# Patient Record
Sex: Female | Born: 1935 | Race: Asian | Hispanic: No | Smoking: Never smoker
Health system: Southern US, Community
[De-identification: ages and names within clinical notes are randomized; demographics above are authoritative.]

## PROBLEM LIST (undated history)

## (undated) DIAGNOSIS — E7800 Pure hypercholesterolemia, unspecified: Secondary | ICD-10-CM

## (undated) DIAGNOSIS — E119 Type 2 diabetes mellitus without complications: Secondary | ICD-10-CM

## (undated) DIAGNOSIS — G459 Transient cerebral ischemic attack, unspecified: Secondary | ICD-10-CM

## (undated) DIAGNOSIS — I10 Essential (primary) hypertension: Secondary | ICD-10-CM

## (undated) HISTORY — PX: CATARACT EXTRACTION W/ INTRAOCULAR LENS  IMPLANT, BILATERAL: SHX1307

## (undated) HISTORY — PX: PTERYGIUM EXCISION: SHX2273

## (undated) HISTORY — PX: EYE SURGERY: SHX253

---

## 2004-08-27 HISTORY — PX: CAPSULOTOMY: SHX379

## 2009-07-05 ENCOUNTER — Encounter: Admission: RE | Admit: 2009-07-05 | Discharge: 2009-07-05 | Payer: Self-pay | Admitting: Family Medicine

## 2018-01-23 ENCOUNTER — Other Ambulatory Visit: Payer: Self-pay

## 2018-01-23 ENCOUNTER — Emergency Department (HOSPITAL_COMMUNITY): Payer: Medicare Other

## 2018-01-23 ENCOUNTER — Observation Stay (HOSPITAL_COMMUNITY): Payer: Medicare Other

## 2018-01-23 ENCOUNTER — Observation Stay (HOSPITAL_COMMUNITY)
Admission: EM | Admit: 2018-01-23 | Discharge: 2018-01-24 | Disposition: A | Discharge status: Home / Self Care | Payer: Medicare Other | Attending: Internal Medicine | Admitting: Internal Medicine

## 2018-01-23 ENCOUNTER — Encounter (HOSPITAL_COMMUNITY): Payer: Self-pay | Admitting: Emergency Medicine

## 2018-01-23 DIAGNOSIS — E785 Hyperlipidemia, unspecified: Secondary | ICD-10-CM | POA: Diagnosis present

## 2018-01-23 DIAGNOSIS — I10 Essential (primary) hypertension: Secondary | ICD-10-CM | POA: Diagnosis present

## 2018-01-23 DIAGNOSIS — R29810 Facial weakness: Secondary | ICD-10-CM | POA: Diagnosis not present

## 2018-01-23 DIAGNOSIS — G459 Transient cerebral ischemic attack, unspecified: Secondary | ICD-10-CM | POA: Diagnosis not present

## 2018-01-23 DIAGNOSIS — R531 Weakness: Secondary | ICD-10-CM

## 2018-01-23 DIAGNOSIS — E876 Hypokalemia: Principal | ICD-10-CM | POA: Insufficient documentation

## 2018-01-23 DIAGNOSIS — R4781 Slurred speech: Secondary | ICD-10-CM | POA: Diagnosis present

## 2018-01-23 DIAGNOSIS — E119 Type 2 diabetes mellitus without complications: Secondary | ICD-10-CM | POA: Diagnosis not present

## 2018-01-23 HISTORY — DX: Pure hypercholesterolemia, unspecified: E78.00

## 2018-01-23 HISTORY — DX: Transient cerebral ischemic attack, unspecified: G45.9

## 2018-01-23 HISTORY — DX: Essential (primary) hypertension: I10

## 2018-01-23 HISTORY — DX: Type 2 diabetes mellitus without complications: E11.9

## 2018-01-23 LAB — I-STAT CHEM 8, ED
BUN: 11 mg/dL (ref 6–20)
CALCIUM ION: 1.1 mmol/L — AB (ref 1.15–1.40)
CHLORIDE: 98 mmol/L — AB (ref 101–111)
Creatinine, Ser: 0.5 mg/dL (ref 0.44–1.00)
GLUCOSE: 149 mg/dL — AB (ref 65–99)
HCT: 38 % (ref 36.0–46.0)
Hemoglobin: 12.9 g/dL (ref 12.0–15.0)
Potassium: 3.1 mmol/L — ABNORMAL LOW (ref 3.5–5.1)
SODIUM: 138 mmol/L (ref 135–145)
TCO2: 27 mmol/L (ref 22–32)

## 2018-01-23 LAB — URINALYSIS, ROUTINE W REFLEX MICROSCOPIC
Bilirubin Urine: NEGATIVE
GLUCOSE, UA: 150 mg/dL — AB
Hgb urine dipstick: NEGATIVE
KETONES UR: NEGATIVE mg/dL
LEUKOCYTES UA: NEGATIVE
Nitrite: NEGATIVE
PROTEIN: NEGATIVE mg/dL
Specific Gravity, Urine: 1.019 (ref 1.005–1.030)
pH: 8 (ref 5.0–8.0)

## 2018-01-23 LAB — COMPREHENSIVE METABOLIC PANEL
ALK PHOS: 59 U/L (ref 38–126)
ALT: 20 U/L (ref 14–54)
AST: 26 U/L (ref 15–41)
Albumin: 3.7 g/dL (ref 3.5–5.0)
Anion gap: 12 (ref 5–15)
BILIRUBIN TOTAL: 1 mg/dL (ref 0.3–1.2)
BUN: 11 mg/dL (ref 6–20)
CALCIUM: 8.9 mg/dL (ref 8.9–10.3)
CO2: 25 mmol/L (ref 22–32)
CREATININE: 0.61 mg/dL (ref 0.44–1.00)
Chloride: 100 mmol/L — ABNORMAL LOW (ref 101–111)
GFR calc Af Amer: >60 mL/min (ref >60)
GFR calc non Af Amer: >60 mL/min (ref >60)
GLUCOSE: 145 mg/dL — AB (ref 65–99)
Potassium: 3.2 mmol/L — ABNORMAL LOW (ref 3.5–5.1)
Sodium: 137 mmol/L (ref 135–145)
TOTAL PROTEIN: 6.7 g/dL (ref 6.5–8.1)

## 2018-01-23 LAB — CBC
HEMATOCRIT: 37 % (ref 36.0–46.0)
Hemoglobin: 12.2 g/dL (ref 12.0–15.0)
MCH: 24.3 pg — AB (ref 26.0–34.0)
MCHC: 33 g/dL (ref 30.0–36.0)
MCV: 73.7 fL — AB (ref 78.0–100.0)
PLATELETS: 192 10*3/uL (ref 150–400)
RBC: 5.02 MIL/uL (ref 3.87–5.11)
RDW: 14.8 % (ref 11.5–15.5)
WBC: 6.6 10*3/uL (ref 4.0–10.5)

## 2018-01-23 LAB — DIFFERENTIAL
ABS IMMATURE GRANULOCYTES: 0 10*3/uL (ref 0.0–0.1)
Basophils Absolute: 0 10*3/uL (ref 0.0–0.1)
Basophils Relative: 0 %
Eosinophils Absolute: 0 10*3/uL (ref 0.0–0.7)
Eosinophils Relative: 0 %
IMMATURE GRANULOCYTES: 0 %
LYMPHS ABS: 1.9 10*3/uL (ref 0.7–4.0)
LYMPHS PCT: 29 %
MONOS PCT: 6 %
Monocytes Absolute: 0.4 10*3/uL (ref 0.1–1.0)
Neutro Abs: 4.2 10*3/uL (ref 1.7–7.7)
Neutrophils Relative %: 65 %

## 2018-01-23 LAB — CBG MONITORING, ED: Glucose-Capillary: 139 mg/dL — ABNORMAL HIGH (ref 65–99)

## 2018-01-23 LAB — I-STAT TROPONIN, ED: Troponin i, poc: 0.01 ng/mL (ref 0.00–0.08)

## 2018-01-23 LAB — RAPID URINE DRUG SCREEN, HOSP PERFORMED
Amphetamines: NOT DETECTED
BARBITURATES: NOT DETECTED
Benzodiazepines: NOT DETECTED
COCAINE: NOT DETECTED
Opiates: NOT DETECTED
TETRAHYDROCANNABINOL: NOT DETECTED

## 2018-01-23 LAB — GLUCOSE, CAPILLARY: Glucose-Capillary: 193 mg/dL — ABNORMAL HIGH (ref 65–99)

## 2018-01-23 LAB — PROTIME-INR
INR: 0.89
PROTHROMBIN TIME: 11.9 s (ref 11.4–15.2)

## 2018-01-23 LAB — TSH: TSH: 0.541 u[IU]/mL (ref 0.350–4.500)

## 2018-01-23 LAB — HEMOGLOBIN A1C
HEMOGLOBIN A1C: 6 % — AB (ref 4.8–5.6)
Mean Plasma Glucose: 125.5 mg/dL

## 2018-01-23 LAB — APTT: aPTT: 29 seconds (ref 24–36)

## 2018-01-23 LAB — ETHANOL: Alcohol, Ethyl (B): <10 mg/dL (ref <10)

## 2018-01-23 MED ORDER — STROKE: EARLY STAGES OF RECOVERY BOOK
Freq: Once | Status: AC
  Administered 2018-01-23: 20:00:00
  Filled 2018-01-23: qty 1

## 2018-01-23 MED ORDER — ACETAMINOPHEN 325 MG PO TABS: 650.0000 mg | ORAL_TABLET | ORAL | Status: DC | PRN

## 2018-01-23 MED ORDER — ASPIRIN 325 MG PO TABS
325.0000 mg | ORAL_TABLET | Freq: Every day | ORAL | Status: DC
  Administered 2018-01-24: 325 mg via ORAL
  Filled 2018-01-23: qty 1

## 2018-01-23 MED ORDER — INSULIN ASPART 100 UNIT/ML ~~LOC~~ SOLN: 0.0000 [IU] | Freq: Every day | SUBCUTANEOUS | Status: DC

## 2018-01-23 MED ORDER — IOPAMIDOL (ISOVUE-370) INJECTION 76%
INTRAVENOUS | Status: AC
  Filled 2018-01-23: qty 50

## 2018-01-23 MED ORDER — IOPAMIDOL (ISOVUE-370) INJECTION 76%
INTRAVENOUS | Status: AC
  Administered 2018-01-23: 11:00:00
  Filled 2018-01-23: qty 50

## 2018-01-23 MED ORDER — INSULIN ASPART 100 UNIT/ML ~~LOC~~ SOLN
0.0000 [IU] | Freq: Three times a day (TID) | SUBCUTANEOUS | Status: DC
  Administered 2018-01-24: 1 [IU] via SUBCUTANEOUS

## 2018-01-23 MED ORDER — ACETAMINOPHEN 160 MG/5ML PO SOLN: 650.0000 mg | ORAL | Status: DC | PRN

## 2018-01-23 MED ORDER — ASPIRIN 300 MG RE SUPP: 300.0000 mg | Freq: Every day | RECTAL | Status: DC

## 2018-01-23 MED ORDER — ACETAMINOPHEN 650 MG RE SUPP: 650.0000 mg | RECTAL | Status: DC | PRN

## 2018-01-23 MED ORDER — SODIUM CHLORIDE 0.9 % IV SOLN
INTRAVENOUS | Status: DC
  Administered 2018-01-23: 15:00:00 via INTRAVENOUS

## 2018-01-23 MED ORDER — SENNOSIDES-DOCUSATE SODIUM 8.6-50 MG PO TABS
1.0000 | ORAL_TABLET | Freq: Every evening | ORAL | Status: DC | PRN
Start: 2018-01-23 — End: 2018-01-24

## 2018-01-23 MED ORDER — ENOXAPARIN SODIUM 40 MG/0.4ML ~~LOC~~ SOLN
40.0000 mg | SUBCUTANEOUS | Status: DC
  Administered 2018-01-23: 40 mg via SUBCUTANEOUS
  Filled 2018-01-23 (×3): qty 0.4

## 2018-01-23 MED ORDER — ATORVASTATIN CALCIUM 80 MG PO TABS
80.0000 mg | ORAL_TABLET | Freq: Every day | ORAL | Status: DC
  Administered 2018-01-23 – 2018-01-24 (×2): 80 mg via ORAL
  Filled 2018-01-23 (×3): qty 1

## 2018-01-23 NOTE — ED Notes (Signed)
Pt does not need anxiety meds prior to MRI

## 2018-01-23 NOTE — ED Notes (Signed)
Patient transported to X-ray 

## 2018-01-23 NOTE — ED Provider Notes (Signed)
[facility] EMERGENCY DEPARTMENT Provider Note   CSN: [id number] Arrival date & time: 01/23/18  1019   An emergency department physician performed an initial assessment on this suspected stroke patient at 1044.  History   Chief Complaint Chief Complaint  Patient presents with  . Code Stroke    HPI Shams Fill Pinegar is a 83 y.o. female.  The history is provided by the patient and medical records. A language interpreter was used (Family at bedside aiding in translation).   AYBREE LANYON is a 83 y.o. female  with a PMH of DM, HTN, HLD who presents to the Emergency Department complaining of left-sided weakness and slurred speech. She has noticed some left-sided weakness over the last two weakness, but never had any difficulty with her speech. Felt her usual self last night. This morning, around 8:30 am, husband found patient in the bathroom not wanting to move. She was much weaker on the left than previously over the last two weeks and was not talking. When she did begin speaking again several minutes later, speech was quite slurred and not baseline.   Past Medical History:  Diagnosis Date  . Diabetes mellitus without complication (HCC)   . High cholesterol   . Hypertension     There are no active problems to display for this patient.  OB History   None      Home Medications    Prior to Admission medications   Not on File    Family History Family History  Problem Relation Age of Onset  . Hypertension Mother   . Hypertension Father     Social History Social History   Tobacco Use  . Smoking status: Never Smoker  . Smokeless tobacco: Never Used  Substance Use Topics  . Alcohol use: Not on file  . Drug use: Not on file     Allergies   Patient has no known allergies.   Review of Systems Review of Systems  Neurological: Positive for speech difficulty and weakness.  All other systems reviewed and are negative.    Physical Exam Updated Vital  Signs BP (!) 187/79   Pulse 77   Temp 98.6 F (37 C) (Oral)   Resp (!) 21   Ht 5' (1.524 m)   Wt 48.9 kg (107 lb 12.8 oz)   LMP  (LMP Unknown)   SpO2 99%   BMI 21.05 kg/m   Physical Exam  Constitutional: She is oriented to person, place, and time. She appears well-developed and well-nourished. No distress.  HENT:  Head: Normocephalic and atraumatic.  Cardiovascular: Normal rate, regular rhythm and normal heart sounds.  No murmur heard. Pulmonary/Chest: Effort normal and breath sounds normal. No respiratory distress.  Abdominal: Soft. She exhibits no distension. There is no tenderness.  Musculoskeletal: Normal range of motion.  Neurological: She is alert and oriented to person, place, and time.  Right facial droop, decreased sensation to the left side of the face. Dysmetria with finger-to-nose on the left. Left upper/lower extremity 4/5 muscle strength. 5/5 on right.   Skin: Skin is warm and dry.  Nursing note and vitals reviewed.    ED Treatments / Results  Labs (all labs ordered are listed, but only abnormal results are displayed) Labs Reviewed  CBC - Abnormal; Notable for the following components:      Result Value   MCV 73.7 (*)    MCH 24.3 (*)    All other components within normal limits  COMPREHENSIVE METABOLIC PANEL - Abnormal;  Notable for the following components:   Potassium 3.2 (*)    Chloride 100 (*)    Glucose, Bld 145 (*)    All other components within normal limits  URINALYSIS, ROUTINE W REFLEX MICROSCOPIC - Abnormal; Notable for the following components:   Color, Urine COLORLESS (*)    Glucose, UA 150 (*)    All other components within normal limits  I-STAT CHEM 8, ED - Abnormal; Notable for the following components:   Potassium 3.1 (*)    Chloride 98 (*)    Glucose, Bld 149 (*)    Calcium, Ion 1.10 (*)    All other components within normal limits  ETHANOL  PROTIME-INR  APTT  DIFFERENTIAL  RAPID URINE DRUG SCREEN, HOSP PERFORMED  I-STAT  TROPONIN, ED    EKG None  Radiology Ct Angio Head W Or Wo Contrast  Result Date: 01/23/2018 CLINICAL DATA:  Left-sided weakness EXAM: CT ANGIOGRAPHY HEAD AND NECK CT PERFUSION BRAIN TECHNIQUE: Multidetector CT imaging of the head and neck was performed using the standard protocol during bolus administration of intravenous contrast. Multiplanar CT image reconstructions and MIPs were obtained to evaluate the vascular anatomy. Carotid stenosis measurements (when applicable) are obtained utilizing NASCET criteria, using the distal internal carotid diameter as the denominator. Multiphase CT imaging of the brain was performed following IV bolus contrast injection. Subsequent parametric perfusion maps were calculated using RAPID software. CONTRAST:  Dose not currently available, see chart. COMPARISON:  Noncontrast head CT earlier today FINDINGS: CTA NECK FINDINGS Aortic arch: Atherosclerotic plaque.  No dilatation or dissection. Right carotid system: Atherosclerotic plaque at the common carotid bifurcation primarily affecting the narrowed ECA origin. The ICA is smooth and diffusely patent. Left carotid system: Moderate mainly calcified plaque at the common carotid bifurcation without flow limiting stenosis or ulceration in the proximal ICA. Negative for beading. Vertebral arteries: Subclavian atheromatous narrowing with up to 50% stenosis at the proximal right subclavian. There is moderate (50%) narrowing the origin of the non dominant left vertebral artery. No ulceration or beading. Skeleton: No acute or aggressive finding Other neck: Negative Upper chest: Scanogram shows coarse interstitial opacity at the right base with probable pleural thickening there is scarring at the right apex with volume loss. Review of the MIP images confirms the above findings CTA HEAD FINDINGS Anterior circulation: No emergent occlusion. Calcified plaque along the carotid siphons with up to 50% stenosis on the at the bilateral clinoid  segments (measured on coronal reformats). Mild atheromatous narrowing of bilateral medium size branches. Posterior circulation: Right dominant vertebral artery. Diffusely patent vertebral and basilar arteries. Symmetric PICA and AICA flow. Mild atheromatous narrowing of bilateral posterior cerebral arteries. Negative for aneurysm. Venous sinuses: Patent Anatomic variants: None significant Delayed phase: Not obtained in the emergent setting Review of the MIP images confirms the above findings CT Brain Perfusion Findings: CBF (<30%) Volume: 0mL Perfusion (Tmax>6.0s) volume: 0mL IMPRESSION: 1. No emergent large vessel occlusion or infarct by CT perfusion. 2. Atherosclerosis with up to 50% stenosis at the bilateral ICA clinoid segment, proximal right subclavian artery, and left vertebral origin. 3. No noted ulceration. 4. Right lung opacity seen on scanogram, recommend chest x-ray. Electronically Signed   By: Marnee Spring M.D.   On: 01/23/2018 11:35   Dg Chest 2 View  Result Date: 01/23/2018 CLINICAL DATA:  Left arm weakness. EXAM: CHEST - 2 VIEW COMPARISON:  CT scan of July 05, 2009. FINDINGS: The heart size and mediastinal contours are within normal limits. No pneumothorax is noted.  Left lung is clear. Right apical pleural thickening or scarring is noted as described on prior CT scan. No acute pleural effusion is noted. There is continued presence of large soft tissue density in the right lung base with associated calcifications, consistent with chronic pleural thickening and scarring and associated calcifications described on prior CT scan, most likely due to prior hemothorax or empyema. The visualized skeletal structures are unremarkable. IMPRESSION: Large amount of chronic pleural thickening and scarring is noted in the right lung base with associated calcifications as described on prior CT scan, most likely due to prior hemothorax or empyema. Stable right apical thickening is noted. Electronically Signed    By: Lupita Raider, M.D.   On: 01/23/2018 12:59   Ct Angio Neck W Or Wo Contrast  Result Date: 01/23/2018 CLINICAL DATA:  Left-sided weakness EXAM: CT ANGIOGRAPHY HEAD AND NECK CT PERFUSION BRAIN TECHNIQUE: Multidetector CT imaging of the head and neck was performed using the standard protocol during bolus administration of intravenous contrast. Multiplanar CT image reconstructions and MIPs were obtained to evaluate the vascular anatomy. Carotid stenosis measurements (when applicable) are obtained utilizing NASCET criteria, using the distal internal carotid diameter as the denominator. Multiphase CT imaging of the brain was performed following IV bolus contrast injection. Subsequent parametric perfusion maps were calculated using RAPID software. CONTRAST:  Dose not currently available, see chart. COMPARISON:  Noncontrast head CT earlier today FINDINGS: CTA NECK FINDINGS Aortic arch: Atherosclerotic plaque.  No dilatation or dissection. Right carotid system: Atherosclerotic plaque at the common carotid bifurcation primarily affecting the narrowed ECA origin. The ICA is smooth and diffusely patent. Left carotid system: Moderate mainly calcified plaque at the common carotid bifurcation without flow limiting stenosis or ulceration in the proximal ICA. Negative for beading. Vertebral arteries: Subclavian atheromatous narrowing with up to 50% stenosis at the proximal right subclavian. There is moderate (50%) narrowing the origin of the non dominant left vertebral artery. No ulceration or beading. Skeleton: No acute or aggressive finding Other neck: Negative Upper chest: Scanogram shows coarse interstitial opacity at the right base with probable pleural thickening there is scarring at the right apex with volume loss. Review of the MIP images confirms the above findings CTA HEAD FINDINGS Anterior circulation: No emergent occlusion. Calcified plaque along the carotid siphons with up to 50% stenosis on the at the  bilateral clinoid segments (measured on coronal reformats). Mild atheromatous narrowing of bilateral medium size branches. Posterior circulation: Right dominant vertebral artery. Diffusely patent vertebral and basilar arteries. Symmetric PICA and AICA flow. Mild atheromatous narrowing of bilateral posterior cerebral arteries. Negative for aneurysm. Venous sinuses: Patent Anatomic variants: None significant Delayed phase: Not obtained in the emergent setting Review of the MIP images confirms the above findings CT Brain Perfusion Findings: CBF (<30%) Volume: 0mL Perfusion (Tmax>6.0s) volume: 0mL IMPRESSION: 1. No emergent large vessel occlusion or infarct by CT perfusion. 2. Atherosclerosis with up to 50% stenosis at the bilateral ICA clinoid segment, proximal right subclavian artery, and left vertebral origin. 3. No noted ulceration. 4. Right lung opacity seen on scanogram, recommend chest x-ray. Electronically Signed   By: Marnee Spring M.D.   On: 01/23/2018 11:35   Ct Cerebral Perfusion W Contrast  Result Date: 01/23/2018 CLINICAL DATA:  Left-sided weakness EXAM: CT ANGIOGRAPHY HEAD AND NECK CT PERFUSION BRAIN TECHNIQUE: Multidetector CT imaging of the head and neck was performed using the standard protocol during bolus administration of intravenous contrast. Multiplanar CT image reconstructions and MIPs were obtained to evaluate the  vascular anatomy. Carotid stenosis measurements (when applicable) are obtained utilizing NASCET criteria, using the distal internal carotid diameter as the denominator. Multiphase CT imaging of the brain was performed following IV bolus contrast injection. Subsequent parametric perfusion maps were calculated using RAPID software. CONTRAST:  Dose not currently available, see chart. COMPARISON:  Noncontrast head CT earlier today FINDINGS: CTA NECK FINDINGS Aortic arch: Atherosclerotic plaque.  No dilatation or dissection. Right carotid system: Atherosclerotic plaque at the common  carotid bifurcation primarily affecting the narrowed ECA origin. The ICA is smooth and diffusely patent. Left carotid system: Moderate mainly calcified plaque at the common carotid bifurcation without flow limiting stenosis or ulceration in the proximal ICA. Negative for beading. Vertebral arteries: Subclavian atheromatous narrowing with up to 50% stenosis at the proximal right subclavian. There is moderate (50%) narrowing the origin of the non dominant left vertebral artery. No ulceration or beading. Skeleton: No acute or aggressive finding Other neck: Negative Upper chest: Scanogram shows coarse interstitial opacity at the right base with probable pleural thickening there is scarring at the right apex with volume loss. Review of the MIP images confirms the above findings CTA HEAD FINDINGS Anterior circulation: No emergent occlusion. Calcified plaque along the carotid siphons with up to 50% stenosis on the at the bilateral clinoid segments (measured on coronal reformats). Mild atheromatous narrowing of bilateral medium size branches. Posterior circulation: Right dominant vertebral artery. Diffusely patent vertebral and basilar arteries. Symmetric PICA and AICA flow. Mild atheromatous narrowing of bilateral posterior cerebral arteries. Negative for aneurysm. Venous sinuses: Patent Anatomic variants: None significant Delayed phase: Not obtained in the emergent setting Review of the MIP images confirms the above findings CT Brain Perfusion Findings: CBF (<30%) Volume: 0mL Perfusion (Tmax>6.0s) volume: 0mL IMPRESSION: 1. No emergent large vessel occlusion or infarct by CT perfusion. 2. Atherosclerosis with up to 50% stenosis at the bilateral ICA clinoid segment, proximal right subclavian artery, and left vertebral origin. 3. No noted ulceration. 4. Right lung opacity seen on scanogram, recommend chest x-ray. Electronically Signed   By: Marnee Spring M.D.   On: 01/23/2018 11:35   Ct Head Code Stroke Wo  Contrast  Result Date: 01/23/2018 CLINICAL DATA:  Code stroke.  Left-sided weakness EXAM: CT HEAD WITHOUT CONTRAST TECHNIQUE: Contiguous axial images were obtained from the base of the skull through the vertex without intravenous contrast. COMPARISON:  None. FINDINGS: Brain: No evidence of acute infarction, hemorrhage, hydrocephalus, extra-axial collection or mass lesion/mass effect. Low-density in the cerebral white matter attributed to chronic small vessel ischemia. Lacunar infarcts seen in the left putamen and caudate, age-indeterminate but not contralateral to the symptomatic side Vascular: Atherosclerotic calcification.  No hyperdense vessel. Skull: No acute finding Sinuses/Orbits: No acute finding. Other: These results were communicated to Dr. Amada Jupiter at 11:22 amon 5/30/2019by text page via the [facility] messaging system. ASPECTS [facility] Stroke Program Early CT Score) - Ganglionic level infarction (caudate, lentiform nuclei, internal capsule, insula, M1-M3 cortex): 7 - Supraganglionic infarction (M4-M6 cortex): 3 Total score (0-10 with 10 being normal): 10 IMPRESSION: 1. No acute finding.ASPECTS is 10. 2. Chronic small vessel ischemia. Electronically Signed   By: Marnee Spring M.D.   On: 01/23/2018 11:24    Procedures Procedures (including critical care time)  Medications Ordered in ED Medications  iopamidol (ISOVUE-370) 76 % injection (has no administration in time range)  iopamidol (ISOVUE-370) 76 % injection (  Contrast Given 01/23/18 1100)     Initial Impression / Assessment and Plan / ED Course  I have reviewed  the triage vital signs and the nursing notes.  Pertinent labs & imaging results that were available during my care of the patient were reviewed by me and considered in my medical decision making (see chart for details).    LAYAAN MOTT is a 83 y.o. female who presents to ED for left sided weakness and slurred speech. Code stroke called upon arrival. CT's reviewed with no  acute findings. Neurology has evaluated - please see consultation note for full recommendations.  I appreciate their assistance in patient's care today.  Recommend hospitalist admission for further work-up.  Hospitalist consulted who will admit.  Patient discussed with Dr. Rush Landmark who agrees with treatment plan.   Final Clinical Impressions(s) / ED Diagnoses   Final diagnoses:  Weakness  Slurred speech  Hypokalemia    ED Discharge Orders    None       Clenton Esper, Chase Picket, [place] 01/23/18 1319    Tegeler, Canary Brim, MD 01/23/18 [phone]

## 2018-01-23 NOTE — Consult Note (Addendum)
Requesting Physician: Dr. Julieanne Manson    Chief Complaint: Stroke  History obtained from: Patient's family  HPI:                                                                                                                                         Shannon Coffey is an 83 y.o. female with known hypertension, hyperlipidemia and diabetes.  Patient does not take any antiplatelets.  Approximately 2 weeks ago was noted that she had left-sided weakness.  Per family as she does not speak English, the weakness was approximately 50% less than normal.  She did not go to a physician or come to the hospital for this weakness.  Apparently she went to bed at 2200 hrs. and she was normal last night.  Daughter called her and saw her via video camera at 630 but did not watch her move.  At approximately 830 this morning her husband found patient leaning up against the wall in the bathroom but not wanting to move.  They brought her back to the bed.  Patient needed assistance at that time.  At approximately 9:00 they noticed that she was much weaker on her left and she was mute for about 10 minutes and then she had significant dysarthria.  She was brought to the hospital.  While in triage code stroke was called.  Due to language barrier it was very unclear at what time initially she was last seen normal.  CT of head did not show any acute stroke.   Date last known well: Date: 01/22/2018 Time last known well: Time: 22:00 tPA Given: No: Due to speech resolved and outside of the window NIH stroke scale of 3 Modified Rankin: Rankin Score=1    Past Medical History:  Diagnosis Date  . Diabetes mellitus without complication (HCC)   . High cholesterol   . Hypertension     She has had eye surgery unclear what eye surgery was done  Family History  Problem Relation Age of Onset  . Hypertension Mother   . Hypertension Father        Social History:  reports that she has never smoked. She has never used smokeless tobacco. Her  alcohol and drug histories are not on file.  Allergies: Allergies no known allergies  Medications:  Current Facility-Administered Medications  Medication Dose Route Frequency Provider Last Rate Last Dose  . iopamidol (ISOVUE-370) 76 % injection            No current outpatient medications on file.     ROS:                                                                                                                                       History obtained from Family members  General ROS: negative for - chills, fatigue, fever, night sweats, weight gain or weight loss Psychological ROS: negative for - , hallucinations, memory difficulties, mood swings or  Ophthalmic ROS: negative for - blurry vision, double vision, eye pain or loss of vision ENT ROS: negative for - epistaxis, nasal discharge, oral lesions, sore throat, tinnitus or vertigo Respiratory ROS: negative for - cough,  shortness of breath or wheezing Cardiovascular ROS: negative for - chest pain, dyspnea on exertion,  Gastrointestinal ROS: negative for - abdominal pain, diarrhea,  nausea/vomiting or stool incontinence Genito-Urinary ROS: negative for - dysuria, hematuria, incontinence or urinary frequency/urgency Musculoskeletal ROS: Positive for -  muscular weakness Neurological ROS: as noted in HPI   General Examination:                                                                                                      Blood pressure (!) 162/67, pulse 74, temperature 98.6 F (37 C), temperature source Oral, resp. rate 20, height 5' (1.524 m), weight 48.9 kg (107 lb 12.8 oz), SpO2 95 %.  HEENT-  Normocephalic, no lesions, without obvious abnormality.  Normal external eye and conjunctiva.   Cardiovascular- S1-S2 audible, pulses palpable throughout   Lungs-no rhonchi or wheezing noted, no excessive working  breathing.  Saturations within normal limits Abdomen- All 4 quadrants palpated and nontender Extremities- Warm, dry and intact Musculoskeletal-no joint tenderness, deformity or swelling Skin-warm and dry, no hyperpigmentation, vitiligo, or suspicious lesions  Neurological Examination Mental Status: Patient is alert, she is oriented to her age and hospital but is unable to give the correct month.  Due to language barrier is difficult to have her follow commands but eventually she was able to follow commands.  These were simple commands and not complex commands.  Patient did not show any a aphasia or dysarthria at this time. Cranial Nerves: II: Initially visual fields were inconsistent however given time she was able to count fingers in all 4 quadrants.  Again this was due to language barrier III,IV,  VI: ptosis not present, extra-ocular motions intact bilaterally, pupils equal, round, reactive to light and accommodation V,VII: Right facial droop, facial light touch sensation normal bilaterally VIII: hearing normal bilaterally IX,X: uvula rises symmetrically XI: bilateral shoulder shrug XII: midline tongue extension Motor: Right : Upper extremity   5/5    Left:     Upper extremity   5/5  Lower extremity   4/5     Lower extremity   4/5 Tone and bulk:normal tone throughout; no atrophy noted Sensory: Decreased on the left, sensory to double simultaneous stimuli was very inconsistent Deep Tendon Reflexes: 2+ and symmetric throughout Plantars: Right: downgoing   Left: downgoing Cerebellar: Dysmetria on the left finger-to-nose however this may be secondary to weakness, patient did not understand doing heel-to-shin with her left heel over her right shin but with right heel over her left shin she had no difficulty.   Gait: Not tested   Lab Results: Basic Metabolic Panel: Recent Labs  Lab 01/23/18 1041 01/23/18 1045  NA 137 138  K 3.2* 3.1*  CL 100* 98*  CO2 25  --   GLUCOSE 145* 149*   BUN 11 11  CREATININE 0.61 0.50  CALCIUM 8.9  --     CBC: Recent Labs  Lab 01/23/18 1041 01/23/18 1045  WBC 6.6  --   NEUTROABS 4.2  --   HGB 12.2 12.9  HCT 37.0 38.0  MCV 73.7*  --   PLT 192  --     Lipid Panel: No results for input(s): CHOL, TRIG, HDL, CHOLHDL, VLDL, LDLCALC in the last 168 hours.  CBG: No results for input(s): GLUCAP in the last 168 hours.  Imaging: Ct Angio Head W Or Wo Contrast  Result Date: 01/23/2018 CLINICAL DATA:  Left-sided weakness EXAM: CT ANGIOGRAPHY HEAD AND NECK CT PERFUSION BRAIN TECHNIQUE: Multidetector CT imaging of the head and neck was performed using the standard protocol during bolus administration of intravenous contrast. Multiplanar CT image reconstructions and MIPs were obtained to evaluate the vascular anatomy. Carotid stenosis measurements (when applicable) are obtained utilizing NASCET criteria, using the distal internal carotid diameter as the denominator. Multiphase CT imaging of the brain was performed following IV bolus contrast injection. Subsequent parametric perfusion maps were calculated using RAPID software. CONTRAST:  Dose not currently available, see chart. COMPARISON:  Noncontrast head CT earlier today FINDINGS: CTA NECK FINDINGS Aortic arch: Atherosclerotic plaque.  No dilatation or dissection. Right carotid system: Atherosclerotic plaque at the common carotid bifurcation primarily affecting the narrowed ECA origin. The ICA is smooth and diffusely patent. Left carotid system: Moderate mainly calcified plaque at the common carotid bifurcation without flow limiting stenosis or ulceration in the proximal ICA. Negative for beading. Vertebral arteries: Subclavian atheromatous narrowing with up to 50% stenosis at the proximal right subclavian. There is moderate (50%) narrowing the origin of the non dominant left vertebral artery. No ulceration or beading. Skeleton: No acute or aggressive finding Other neck: Negative Upper chest:  Scanogram shows coarse interstitial opacity at the right base with probable pleural thickening there is scarring at the right apex with volume loss. Review of the MIP images confirms the above findings CTA HEAD FINDINGS Anterior circulation: No emergent occlusion. Calcified plaque along the carotid siphons with up to 50% stenosis on the at the bilateral clinoid segments (measured on coronal reformats). Mild atheromatous narrowing of bilateral medium size branches. Posterior circulation: Right dominant vertebral artery. Diffusely patent vertebral and basilar arteries. Symmetric PICA and AICA flow. Mild atheromatous narrowing of bilateral  posterior cerebral arteries. Negative for aneurysm. Venous sinuses: Patent Anatomic variants: None significant Delayed phase: Not obtained in the emergent setting Review of the MIP images confirms the above findings CT Brain Perfusion Findings: CBF (<30%) Volume: 0mL Perfusion (Tmax>6.0s) volume: 0mL IMPRESSION: 1. No emergent large vessel occlusion or infarct by CT perfusion. 2. Atherosclerosis with up to 50% stenosis at the bilateral ICA clinoid segment, proximal right subclavian artery, and left vertebral origin. 3. No noted ulceration. 4. Right lung opacity seen on scanogram, recommend chest x-ray. Electronically Signed   By: Marnee Spring M.D.   On: 01/23/2018 11:35   Ct Angio Neck W Or Wo Contrast  Result Date: 01/23/2018 CLINICAL DATA:  Left-sided weakness EXAM: CT ANGIOGRAPHY HEAD AND NECK CT PERFUSION BRAIN TECHNIQUE: Multidetector CT imaging of the head and neck was performed using the standard protocol during bolus administration of intravenous contrast. Multiplanar CT image reconstructions and MIPs were obtained to evaluate the vascular anatomy. Carotid stenosis measurements (when applicable) are obtained utilizing NASCET criteria, using the distal internal carotid diameter as the denominator. Multiphase CT imaging of the brain was performed following IV bolus  contrast injection. Subsequent parametric perfusion maps were calculated using RAPID software. CONTRAST:  Dose not currently available, see chart. COMPARISON:  Noncontrast head CT earlier today FINDINGS: CTA NECK FINDINGS Aortic arch: Atherosclerotic plaque.  No dilatation or dissection. Right carotid system: Atherosclerotic plaque at the common carotid bifurcation primarily affecting the narrowed ECA origin. The ICA is smooth and diffusely patent. Left carotid system: Moderate mainly calcified plaque at the common carotid bifurcation without flow limiting stenosis or ulceration in the proximal ICA. Negative for beading. Vertebral arteries: Subclavian atheromatous narrowing with up to 50% stenosis at the proximal right subclavian. There is moderate (50%) narrowing the origin of the non dominant left vertebral artery. No ulceration or beading. Skeleton: No acute or aggressive finding Other neck: Negative Upper chest: Scanogram shows coarse interstitial opacity at the right base with probable pleural thickening there is scarring at the right apex with volume loss. Review of the MIP images confirms the above findings CTA HEAD FINDINGS Anterior circulation: No emergent occlusion. Calcified plaque along the carotid siphons with up to 50% stenosis on the at the bilateral clinoid segments (measured on coronal reformats). Mild atheromatous narrowing of bilateral medium size branches. Posterior circulation: Right dominant vertebral artery. Diffusely patent vertebral and basilar arteries. Symmetric PICA and AICA flow. Mild atheromatous narrowing of bilateral posterior cerebral arteries. Negative for aneurysm. Venous sinuses: Patent Anatomic variants: None significant Delayed phase: Not obtained in the emergent setting Review of the MIP images confirms the above findings CT Brain Perfusion Findings: CBF (<30%) Volume: 0mL Perfusion (Tmax>6.0s) volume: 0mL IMPRESSION: 1. No emergent large vessel occlusion or infarct by CT  perfusion. 2. Atherosclerosis with up to 50% stenosis at the bilateral ICA clinoid segment, proximal right subclavian artery, and left vertebral origin. 3. No noted ulceration. 4. Right lung opacity seen on scanogram, recommend chest x-ray. Electronically Signed   By: Marnee Spring M.D.   On: 01/23/2018 11:35   Ct Cerebral Perfusion W Contrast  Result Date: 01/23/2018 CLINICAL DATA:  Left-sided weakness EXAM: CT ANGIOGRAPHY HEAD AND NECK CT PERFUSION BRAIN TECHNIQUE: Multidetector CT imaging of the head and neck was performed using the standard protocol during bolus administration of intravenous contrast. Multiplanar CT image reconstructions and MIPs were obtained to evaluate the vascular anatomy. Carotid stenosis measurements (when applicable) are obtained utilizing NASCET criteria, using the distal internal carotid diameter as the denominator.  Multiphase CT imaging of the brain was performed following IV bolus contrast injection. Subsequent parametric perfusion maps were calculated using RAPID software. CONTRAST:  Dose not currently available, see chart. COMPARISON:  Noncontrast head CT earlier today FINDINGS: CTA NECK FINDINGS Aortic arch: Atherosclerotic plaque.  No dilatation or dissection. Right carotid system: Atherosclerotic plaque at the common carotid bifurcation primarily affecting the narrowed ECA origin. The ICA is smooth and diffusely patent. Left carotid system: Moderate mainly calcified plaque at the common carotid bifurcation without flow limiting stenosis or ulceration in the proximal ICA. Negative for beading. Vertebral arteries: Subclavian atheromatous narrowing with up to 50% stenosis at the proximal right subclavian. There is moderate (50%) narrowing the origin of the non dominant left vertebral artery. No ulceration or beading. Skeleton: No acute or aggressive finding Other neck: Negative Upper chest: Scanogram shows coarse interstitial opacity at the right base with probable pleural  thickening there is scarring at the right apex with volume loss. Review of the MIP images confirms the above findings CTA HEAD FINDINGS Anterior circulation: No emergent occlusion. Calcified plaque along the carotid siphons with up to 50% stenosis on the at the bilateral clinoid segments (measured on coronal reformats). Mild atheromatous narrowing of bilateral medium size branches. Posterior circulation: Right dominant vertebral artery. Diffusely patent vertebral and basilar arteries. Symmetric PICA and AICA flow. Mild atheromatous narrowing of bilateral posterior cerebral arteries. Negative for aneurysm. Venous sinuses: Patent Anatomic variants: None significant Delayed phase: Not obtained in the emergent setting Review of the MIP images confirms the above findings CT Brain Perfusion Findings: CBF (<30%) Volume: 0mL Perfusion (Tmax>6.0s) volume: 0mL IMPRESSION: 1. No emergent large vessel occlusion or infarct by CT perfusion. 2. Atherosclerosis with up to 50% stenosis at the bilateral ICA clinoid segment, proximal right subclavian artery, and left vertebral origin. 3. No noted ulceration. 4. Right lung opacity seen on scanogram, recommend chest x-ray. Electronically Signed   By: Marnee Spring M.D.   On: 01/23/2018 11:35   Ct Head Code Stroke Wo Contrast  Result Date: 01/23/2018 CLINICAL DATA:  Code stroke.  Left-sided weakness EXAM: CT HEAD WITHOUT CONTRAST TECHNIQUE: Contiguous axial images were obtained from the base of the skull through the vertex without intravenous contrast. COMPARISON:  None. FINDINGS: Brain: No evidence of acute infarction, hemorrhage, hydrocephalus, extra-axial collection or mass lesion/mass effect. Low-density in the cerebral white matter attributed to chronic small vessel ischemia. Lacunar infarcts seen in the left putamen and caudate, age-indeterminate but not contralateral to the symptomatic side Vascular: Atherosclerotic calcification.  No hyperdense vessel. Skull: No acute  finding Sinuses/Orbits: No acute finding. Other: These results were communicated to Dr. Amada Jupiter at 11:22 amon 5/30/2019by text page via the [facility] messaging system. ASPECTS [facility] Stroke Program Early CT Score) - Ganglionic level infarction (caudate, lentiform nuclei, internal capsule, insula, M1-M3 cortex): 7 - Supraganglionic infarction (M4-M6 cortex): 3 Total score (0-10 with 10 being normal): 10 IMPRESSION: 1. No acute finding.ASPECTS is 10. 2. Chronic small vessel ischemia. Electronically Signed   By: Marnee Spring M.D.   On: 01/23/2018 11:24    Assessment and plan discussed with with attending physician and they are in agreement.    Felicie Morn PA-C [facility] [phone]  01/23/2018, 11:45 AM   Assessment: 83 y.o. female brought to the hospital secondary to increased left-sided weakness and initial mute language followed by dysarthria which improved. Given patient was outside the window and symptoms were not of large vessel TPA and intervention were not given.  I suspect that her  left-sided weakness was due to stroke previously, and that she may have had a TIA this morning.  Stroke Risk Factors - diabetes mellitus, hyperlipidemia and hypertension  Recommend --HgbA1c, fasting lipid panel --MRI of the brain without contrast --PT consult, OT consult, Speech consult --Echocardiogram --80 mg of Atorvistatin --Prophylactic therapy-Antiplatelet med: --Aspirin 325 mg daily --Telemetry monitoring --Frequent neuro checks --NPO until passes stroke swallow screen --please page stroke NP  Or  PA  Or MD from 8am -4 pm  as this patient from this time will be  followed by the stroke.   You can look them up on [email or web address]  Password [email or web address]  Ritta Slot, MD [facility] [phone]  If 7pm- 7am, please page neurology on call as listed in AMION.

## 2018-01-23 NOTE — H&P (Signed)
History and Physical    Shannon Coffey EAV:[id number] DOB: [birth year] DOA: 01/23/2018  PCP: "some people in [facility]" Consultants:  None Patient coming from:  Home - lives alone; [place]: son, [phone]  Chief Complaint: aphasia  HPI: Shannon Coffey is a 83 y.o. female with medical history significant of HTN; HLD; and DM presenting with possible CVA.  This morning, she did not get up like normal.  She slept late.  Her daughter called at 8:30.  She went to the bathroom and felt weak.  When she got back to bed, she laid down.  About 9, she was unable to speak at all.  Her speech was gone for more than an hour.  "I think her memory lose something."  She doesn't seem to understand words as clearly now.  Her left side has been weak for months.  On further questioning, she does have intermittent confusion at home, but perhaps this is worse now.   ED Course:  Code stroke.  Very difficult to obtain history.  Left-sided weakness x weeks, slurred speech new this AM.  Symptoms improving.  Neuro has evaluated, think it is TIA.  Recommend stroke evaluation.   Chronic scarring on CXR without respiratory symptoms.  Review of Systems: Unable to assess due to language barrier  Ambulatory Status:  Ambulates without assistance  Past Medical History:  Diagnosis Date  . Diabetes mellitus without complication (HCC)   . High cholesterol   . Hypertension     Past Surgical History:  Procedure Laterality Date  . OTHER SURGICAL HISTORY     Eye surgery    Social History   Socioeconomic History  . Marital status: Unknown    Spouse name: Not on file  . Number of children: Not on file  . Years of education: Not on file  . Highest education level: Not on file  Occupational History  . Occupation: retired  Engineer, production  . Financial resource strain: Not on file  . Food insecurity:    Worry: Not on file    Inability: Not on file  . Transportation needs:    Medical: Not on file    Non-medical: Not on file    Tobacco Use  . Smoking status: Never Smoker  . Smokeless tobacco: Never Used  Substance and Sexual Activity  . Alcohol use: Never    Frequency: Never  . Drug use: Never  . Sexual activity: Not on file  Lifestyle  . Physical activity:    Days per week: Not on file    Minutes per session: Not on file  . Stress: Not on file  Relationships  . Social connections:    Talks on phone: Not on file    Gets together: Not on file    Attends religious service: Not on file    Active member of club or organization: Not on file    Attends meetings of clubs or organizations: Not on file    Relationship status: Not on file  . Intimate partner violence:    Fear of current or ex partner: Not on file    Emotionally abused: Not on file    Physically abused: Not on file    Forced sexual activity: Not on file  Other Topics Concern  . Not on file  Social History Narrative  . Not on file    Allergies no known allergies  Family History  Problem Relation Age of Onset  . Hypertension Mother   . Hypertension Father   .  CVA Neg Hx     Prior to Admission medications   Not on File    Physical Exam: Vitals:   01/23/18 1033 01/23/18 1113 01/23/18 1145 01/23/18 1200  BP: (!) 162/67  (!) 181/79 (!) 187/79  Pulse: 74  75 77  Resp: 20  20 (!) 21  Temp: 98.6 F (37 C)  98.6 F (37 C)   TempSrc: Oral  Oral   SpO2: 95%  98% 99%  Weight:  48.9 kg (107 lb 12.8 oz)    Height:  5' (1.524 m)       General:  Appears calm and comfortable and is NAD Eyes:  PERRL, EOMI, normal lids, iris ENT:  grossly normal hearing, lips & tongue, mmm Neck:  no LAD, masses or thyromegaly; no carotid bruits Cardiovascular:  RRR, no m/r/g. No LE edema.  Respiratory:   CTA bilaterally with no wheezes/rales/rhonchi.  Normal respiratory effort. Abdomen:  soft, NT, ND, NABS Back:   normal alignment, no CVAT Skin:  no rash or induration seen on limited exam Musculoskeletal:  LUE > LLE weakness, good ROM, no bony  abnormality Lower extremity:  No LE edema.  Limited foot exam with no ulcerations.  2+ distal pulses. Psychiatric:  grossly normal mood and affect, speech in [place] Neurologic:  CN 2-12 grossly intact, moves all extremities in coordinated fashion but with weakness on the left, sensation intact; this is limited by a language barrier and also possibly by cognition    Radiological Exams on Admission: Ct Angio Head W Or Wo Contrast  Result Date: 01/23/2018 CLINICAL DATA:  Left-sided weakness EXAM: CT ANGIOGRAPHY HEAD AND NECK CT PERFUSION BRAIN TECHNIQUE: Multidetector CT imaging of the head and neck was performed using the standard protocol during bolus administration of intravenous contrast. Multiplanar CT image reconstructions and MIPs were obtained to evaluate the vascular anatomy. Carotid stenosis measurements (when applicable) are obtained utilizing NASCET criteria, using the distal internal carotid diameter as the denominator. Multiphase CT imaging of the brain was performed following IV bolus contrast injection. Subsequent parametric perfusion maps were calculated using RAPID software. CONTRAST:  Dose not currently available, see chart. COMPARISON:  Noncontrast head CT earlier today FINDINGS: CTA NECK FINDINGS Aortic arch: Atherosclerotic plaque.  No dilatation or dissection. Right carotid system: Atherosclerotic plaque at the common carotid bifurcation primarily affecting the narrowed ECA origin. The ICA is smooth and diffusely patent. Left carotid system: Moderate mainly calcified plaque at the common carotid bifurcation without flow limiting stenosis or ulceration in the proximal ICA. Negative for beading. Vertebral arteries: Subclavian atheromatous narrowing with up to 50% stenosis at the proximal right subclavian. There is moderate (50%) narrowing the origin of the non dominant left vertebral artery. No ulceration or beading. Skeleton: No acute or aggressive finding Other neck: Negative Upper  chest: Scanogram shows coarse interstitial opacity at the right base with probable pleural thickening there is scarring at the right apex with volume loss. Review of the MIP images confirms the above findings CTA HEAD FINDINGS Anterior circulation: No emergent occlusion. Calcified plaque along the carotid siphons with up to 50% stenosis on the at the bilateral clinoid segments (measured on coronal reformats). Mild atheromatous narrowing of bilateral medium size branches. Posterior circulation: Right dominant vertebral artery. Diffusely patent vertebral and basilar arteries. Symmetric PICA and AICA flow. Mild atheromatous narrowing of bilateral posterior cerebral arteries. Negative for aneurysm. Venous sinuses: Patent Anatomic variants: None significant Delayed phase: Not obtained in the emergent setting Review of the MIP images confirms the above  findings CT Brain Perfusion Findings: CBF (<30%) Volume: 0mL Perfusion (Tmax>6.0s) volume: 0mL IMPRESSION: 1. No emergent large vessel occlusion or infarct by CT perfusion. 2. Atherosclerosis with up to 50% stenosis at the bilateral ICA clinoid segment, proximal right subclavian artery, and left vertebral origin. 3. No noted ulceration. 4. Right lung opacity seen on scanogram, recommend chest x-ray. Electronically Signed   By: Marnee Spring M.D.   On: 01/23/2018 11:35   Dg Chest 2 View  Result Date: 01/23/2018 CLINICAL DATA:  Left arm weakness. EXAM: CHEST - 2 VIEW COMPARISON:  CT scan of July 05, 2009. FINDINGS: The heart size and mediastinal contours are within normal limits. No pneumothorax is noted. Left lung is clear. Right apical pleural thickening or scarring is noted as described on prior CT scan. No acute pleural effusion is noted. There is continued presence of large soft tissue density in the right lung base with associated calcifications, consistent with chronic pleural thickening and scarring and associated calcifications described on prior CT scan,  most likely due to prior hemothorax or empyema. The visualized skeletal structures are unremarkable. IMPRESSION: Large amount of chronic pleural thickening and scarring is noted in the right lung base with associated calcifications as described on prior CT scan, most likely due to prior hemothorax or empyema. Stable right apical thickening is noted. Electronically Signed   By: Lupita Raider, M.D.   On: 01/23/2018 12:59   Ct Angio Neck W Or Wo Contrast  Result Date: 01/23/2018 CLINICAL DATA:  Left-sided weakness EXAM: CT ANGIOGRAPHY HEAD AND NECK CT PERFUSION BRAIN TECHNIQUE: Multidetector CT imaging of the head and neck was performed using the standard protocol during bolus administration of intravenous contrast. Multiplanar CT image reconstructions and MIPs were obtained to evaluate the vascular anatomy. Carotid stenosis measurements (when applicable) are obtained utilizing NASCET criteria, using the distal internal carotid diameter as the denominator. Multiphase CT imaging of the brain was performed following IV bolus contrast injection. Subsequent parametric perfusion maps were calculated using RAPID software. CONTRAST:  Dose not currently available, see chart. COMPARISON:  Noncontrast head CT earlier today FINDINGS: CTA NECK FINDINGS Aortic arch: Atherosclerotic plaque.  No dilatation or dissection. Right carotid system: Atherosclerotic plaque at the common carotid bifurcation primarily affecting the narrowed ECA origin. The ICA is smooth and diffusely patent. Left carotid system: Moderate mainly calcified plaque at the common carotid bifurcation without flow limiting stenosis or ulceration in the proximal ICA. Negative for beading. Vertebral arteries: Subclavian atheromatous narrowing with up to 50% stenosis at the proximal right subclavian. There is moderate (50%) narrowing the origin of the non dominant left vertebral artery. No ulceration or beading. Skeleton: No acute or aggressive finding Other neck:  Negative Upper chest: Scanogram shows coarse interstitial opacity at the right base with probable pleural thickening there is scarring at the right apex with volume loss. Review of the MIP images confirms the above findings CTA HEAD FINDINGS Anterior circulation: No emergent occlusion. Calcified plaque along the carotid siphons with up to 50% stenosis on the at the bilateral clinoid segments (measured on coronal reformats). Mild atheromatous narrowing of bilateral medium size branches. Posterior circulation: Right dominant vertebral artery. Diffusely patent vertebral and basilar arteries. Symmetric PICA and AICA flow. Mild atheromatous narrowing of bilateral posterior cerebral arteries. Negative for aneurysm. Venous sinuses: Patent Anatomic variants: None significant Delayed phase: Not obtained in the emergent setting Review of the MIP images confirms the above findings CT Brain Perfusion Findings: CBF (<30%) Volume: 0mL Perfusion (Tmax>6.0s) volume: 0mL  IMPRESSION: 1. No emergent large vessel occlusion or infarct by CT perfusion. 2. Atherosclerosis with up to 50% stenosis at the bilateral ICA clinoid segment, proximal right subclavian artery, and left vertebral origin. 3. No noted ulceration. 4. Right lung opacity seen on scanogram, recommend chest x-ray. Electronically Signed   By: Marnee Spring M.D.   On: 01/23/2018 11:35   Ct Cerebral Perfusion W Contrast  Result Date: 01/23/2018 CLINICAL DATA:  Left-sided weakness EXAM: CT ANGIOGRAPHY HEAD AND NECK CT PERFUSION BRAIN TECHNIQUE: Multidetector CT imaging of the head and neck was performed using the standard protocol during bolus administration of intravenous contrast. Multiplanar CT image reconstructions and MIPs were obtained to evaluate the vascular anatomy. Carotid stenosis measurements (when applicable) are obtained utilizing NASCET criteria, using the distal internal carotid diameter as the denominator. Multiphase CT imaging of the brain was performed  following IV bolus contrast injection. Subsequent parametric perfusion maps were calculated using RAPID software. CONTRAST:  Dose not currently available, see chart. COMPARISON:  Noncontrast head CT earlier today FINDINGS: CTA NECK FINDINGS Aortic arch: Atherosclerotic plaque.  No dilatation or dissection. Right carotid system: Atherosclerotic plaque at the common carotid bifurcation primarily affecting the narrowed ECA origin. The ICA is smooth and diffusely patent. Left carotid system: Moderate mainly calcified plaque at the common carotid bifurcation without flow limiting stenosis or ulceration in the proximal ICA. Negative for beading. Vertebral arteries: Subclavian atheromatous narrowing with up to 50% stenosis at the proximal right subclavian. There is moderate (50%) narrowing the origin of the non dominant left vertebral artery. No ulceration or beading. Skeleton: No acute or aggressive finding Other neck: Negative Upper chest: Scanogram shows coarse interstitial opacity at the right base with probable pleural thickening there is scarring at the right apex with volume loss. Review of the MIP images confirms the above findings CTA HEAD FINDINGS Anterior circulation: No emergent occlusion. Calcified plaque along the carotid siphons with up to 50% stenosis on the at the bilateral clinoid segments (measured on coronal reformats). Mild atheromatous narrowing of bilateral medium size branches. Posterior circulation: Right dominant vertebral artery. Diffusely patent vertebral and basilar arteries. Symmetric PICA and AICA flow. Mild atheromatous narrowing of bilateral posterior cerebral arteries. Negative for aneurysm. Venous sinuses: Patent Anatomic variants: None significant Delayed phase: Not obtained in the emergent setting Review of the MIP images confirms the above findings CT Brain Perfusion Findings: CBF (<30%) Volume: 0mL Perfusion (Tmax>6.0s) volume: 0mL IMPRESSION: 1. No emergent large vessel occlusion or  infarct by CT perfusion. 2. Atherosclerosis with up to 50% stenosis at the bilateral ICA clinoid segment, proximal right subclavian artery, and left vertebral origin. 3. No noted ulceration. 4. Right lung opacity seen on scanogram, recommend chest x-ray. Electronically Signed   By: Marnee Spring M.D.   On: 01/23/2018 11:35   Ct Head Code Stroke Wo Contrast  Result Date: 01/23/2018 CLINICAL DATA:  Code stroke.  Left-sided weakness EXAM: CT HEAD WITHOUT CONTRAST TECHNIQUE: Contiguous axial images were obtained from the base of the skull through the vertex without intravenous contrast. COMPARISON:  None. FINDINGS: Brain: No evidence of acute infarction, hemorrhage, hydrocephalus, extra-axial collection or mass lesion/mass effect. Low-density in the cerebral white matter attributed to chronic small vessel ischemia. Lacunar infarcts seen in the left putamen and caudate, age-indeterminate but not contralateral to the symptomatic side Vascular: Atherosclerotic calcification.  No hyperdense vessel. Skull: No acute finding Sinuses/Orbits: No acute finding. Other: These results were communicated to Dr. Amada Jupiter at 11:22 amon 5/30/2019by text page via the [facility]  messaging system. ASPECTS [facility] Stroke Program Early CT Score) - Ganglionic level infarction (caudate, lentiform nuclei, internal capsule, insula, M1-M3 cortex): 7 - Supraganglionic infarction (M4-M6 cortex): 3 Total score (0-10 with 10 being normal): 10 IMPRESSION: 1. No acute finding.ASPECTS is 10. 2. Chronic small vessel ischemia. Electronically Signed   By: Marnee Spring M.D.   On: 01/23/2018 11:24    EKG: Independently reviewed.  Afib with rate 75; RBBB with no evidence of acute ischemia   Labs on Admission: I have personally reviewed the available labs and imaging studies at the time of the admission.  Pertinent labs:   Glucose 145 Essentially normal CBC Troponin 0.01 INR 0.89 ETOH <10 UA: 150 glucose, otherwise negative UDS  negative   Assessment/Plan Principal Problem:   TIA (transient ischemic attack) Active Problems:   Essential hypertension   Hyperlipidemia   Type 2 diabetes mellitus without complication (HCC)   TIA -Patient with subacute left-sided weakness presenting with acute aphasia this AM -Head CT/CTA negative for large vessel occlusion -Code stroke called, neurology has seen the patient -Will place in observation status for CVA/TIA evaluation -Telemetry monitoring -MRI -Echo -Risk stratification with FLP, A1c; will also check TSH and UDS -ASA daily -PT/OT/ST/Nutrition Consults -Suspect underlying dementia may be a contributing factor as well  HTN -Allow permissive HTN -Treat BP only if >220/120, and then with goal of 15% reduction  HLD -Check FLP -Will plan to start Lipitor 80 mg daily  DM -Will check A1c -Cover with moderate-scale SSI   DVT prophylaxis: Lovenox  Code Status:  DNR - confirmed with patient/family Family Communication: Son and daughter were present throughout evaluation; he is planning to stay since the patient "doesn't always understand" [place] either Disposition Plan:  Home once clinically improved Consults called: Neurology; PT/OT/ST/Nutrition Admission status: It is my clinical opinion that referral for OBSERVATION is reasonable and necessary in this patient based on the above information provided. The aforementioned taken together are felt to place the patient at high risk for further clinical deterioration. However it is anticipated that the patient may be medically stable for discharge from the hospital within 24 to 48 hours.    Jonah Blue MD [facility]  If note is complete, please contact covering daytime or nighttime physician. [email or web address] Password [email or web address]  01/23/2018, 1:59 PM

## 2018-01-23 NOTE — ED Triage Notes (Signed)
Per family about 1 hour she could  Not  talk and left arm weakness, has left arm drift at first but pt was having pain in L arm and shoulder but she had a FALL 2 WEEKS AGO AND HURT HER SHOULDER, , NOW she is talking and has no drift

## 2018-01-23 NOTE — Code Documentation (Signed)
83 yo female coming from home with complaints of new onset of left sided weakness and not speaking this morning. Pt speaks [place] and a language barrier was present. Family reports that patient went to be last night with no symptoms. Pt has hx of some left sided weakness a couple months ago that resolved. Today, pt woke up around 0830 was more lethargic than normal, and went back to sleep. At 0930, family woke pt up and she was weak on the left side and not speaking. Family brought in patient POV. Triage assessed patient and called a Code Stroke. Stroke Team met patient in CT. While in Triage, RN reported that symptoms started to resolve. Pt started to talk with family and weakness in the left side decreased.  Initial NIHSS 6 due to baseline visual loss on the left sided, slight facial droop on the right side, sensory decreased on the left side, limb ataxia noted on both upper and lower left extremity. Pt is alert and oriented to person, place, and situation. Family reports some hx of disorientation to time due to age. CT Negative. CTA/CTP completed - No signs of LVO. No tPA given because outside window. Handoff given to [place], Therapist, sports. Pt to be admitted.

## 2018-01-23 NOTE — ED Notes (Signed)
[facility] contacted to activate Code Stroke at 1044

## 2018-01-24 ENCOUNTER — Observation Stay (HOSPITAL_BASED_OUTPATIENT_CLINIC_OR_DEPARTMENT_OTHER): Payer: Medicare Other

## 2018-01-24 DIAGNOSIS — G459 Transient cerebral ischemic attack, unspecified: Secondary | ICD-10-CM | POA: Diagnosis not present

## 2018-01-24 DIAGNOSIS — E876 Hypokalemia: Secondary | ICD-10-CM | POA: Diagnosis not present

## 2018-01-24 LAB — GLUCOSE, CAPILLARY
GLUCOSE-CAPILLARY: 103 mg/dL — AB (ref 65–99)
GLUCOSE-CAPILLARY: 135 mg/dL — AB (ref 65–99)
GLUCOSE-CAPILLARY: 108 mg/dL — AB (ref 65–99)

## 2018-01-24 LAB — LIPID PANEL
CHOL/HDL RATIO: 2.9 ratio
Cholesterol: 174 mg/dL (ref 0–200)
HDL: 61 mg/dL (ref >40)
LDL Cholesterol: 94 mg/dL (ref 0–99)
TRIGLYCERIDES: 97 mg/dL (ref <150)
VLDL: 19 mg/dL (ref 0–40)

## 2018-01-24 LAB — ECHOCARDIOGRAM COMPLETE
HEIGHTINCHES: 60 in
WEIGHTICAEL: 1724.8 [oz_av]

## 2018-01-24 MED ORDER — ASPIRIN EC 81 MG PO TBEC: 81.0000 mg | DELAYED_RELEASE_TABLET | Freq: Every day | ORAL | 2 refills | Status: AC

## 2018-01-24 MED ORDER — ATORVASTATIN CALCIUM 80 MG PO TABS: 80.0000 mg | ORAL_TABLET | Freq: Every day | ORAL | 0 refills | Status: AC

## 2018-01-24 MED ORDER — CLOPIDOGREL BISULFATE 75 MG PO TABS: 75.0000 mg | ORAL_TABLET | Freq: Every day | ORAL | 0 refills | Status: AC

## 2018-01-24 MED ORDER — LINZESS 290 MCG PO CAPS: 290.0000 ug | ORAL_CAPSULE | Freq: Every day | ORAL | 3 refills | Status: AC

## 2018-01-24 MED ORDER — POTASSIUM CHLORIDE CRYS ER 20 MEQ PO TBCR
40.0000 meq | EXTENDED_RELEASE_TABLET | Freq: Two times a day (BID) | ORAL | Status: DC
  Administered 2018-01-24: 40 meq via ORAL
  Filled 2018-01-24: qty 2

## 2018-01-24 MED ORDER — GLUCERNA SHAKE PO LIQD
237.0000 mL | Freq: Two times a day (BID) | ORAL | Status: DC
  Administered 2018-01-24: 237 mL via ORAL

## 2018-01-24 NOTE — Progress Notes (Signed)
STROKE TEAM PROGRESS NOTE   HISTORY OF PRESENT ILLNESS (per record) Shannon Coffey is an 83 y.o. female with known hypertension, hyperlipidemia and diabetes.  Patient does not take any antiplatelets.  Approximately 2 weeks ago was noted that she had left-sided weakness.  Per family as she does not speak English, the weakness was approximately 50% less than normal.  She did not go to a physician or come to the hospital for this weakness.  Apparently she went to bed at 2200 hrs. and she was normal last night.  Daughter called her and saw her via video camera at 630 but did not watch her move.  At approximately 830 this morning her husband found patient leaning up against the wall in the bathroom but not wanting to move.  They brought her back to the bed.  Patient needed assistance at that time.  At approximately 9:00 they noticed that she was much weaker on her left and she was mute for about 10 minutes and then she had significant dysarthria.  She was brought to the hospital.  While in triage code stroke was called.  Due to language barrier it was very unclear at what time initially she was last seen normal.  CT of head did not show any acute stroke.   Date last known well: Date: 01/22/2018 Time last known well: Time: 22:00 tPA Given: No: Due to speech resolved and outside of the window NIH stroke scale of 3 Modified Rankin: Rankin Score=1     SUBJECTIVE (INTERVAL HISTORY) Two family members the bedside. The patient speaks very little [place]. The family acted as Community education officer. The patient is essentially back to baseline. Dr. Pearlean Brownie explained this was probably a TIA however, she does have an old infarct on her MRI   OBJECTIVE Temp:  [97.5 F (36.4 C)-98.6 F (37 C)] 98.4 F (36.9 C) (05/31 0422) Pulse Rate:  [68-79] 74 (05/31 0422) Cardiac Rhythm: Normal sinus rhythm (05/30 1938) Resp:  [14-21] 18 (05/31 0422) BP: (119-187)/(47-79) 136/64 (05/31 0422) SpO2:  [93 %-99 %] 95 % (05/31  0422) Weight:  [107 lb 12.8 oz (48.9 kg)] 107 lb 12.8 oz (48.9 kg) (05/30 1113)  CBC:  Recent Labs  Lab 01/23/18 1041 01/23/18 1045  WBC 6.6  --   NEUTROABS 4.2  --   HGB 12.2 12.9  HCT 37.0 38.0  MCV 73.7*  --   PLT 192  --     Basic Metabolic Panel:  Recent Labs  Lab 01/23/18 1041 01/23/18 1045  NA 137 138  K 3.2* 3.1*  CL 100* 98*  CO2 25  --   GLUCOSE 145* 149*  BUN 11 11  CREATININE 0.61 0.50  CALCIUM 8.9  --     Lipid Panel:     Component Value Date/Time   CHOL 174 01/24/2018 0441   TRIG 97 01/24/2018 0441   HDL 61 01/24/2018 0441   CHOLHDL 2.9 01/24/2018 0441   VLDL 19 01/24/2018 0441   LDLCALC 94 01/24/2018 0441   HgbA1c:  Lab Results  Component Value Date   HGBA1C 6.0 (H) 01/23/2018   Urine Drug Screen:     Component Value Date/Time   LABOPIA NONE DETECTED 01/23/2018 1149   COCAINSCRNUR NONE DETECTED 01/23/2018 1149   LABBENZ NONE DETECTED 01/23/2018 1149   AMPHETMU NONE DETECTED 01/23/2018 1149   THCU NONE DETECTED 01/23/2018 1149   LABBARB NONE DETECTED 01/23/2018 1149    Alcohol Level     Component Value Date/Time   ETH <10  01/23/2018 1041    IMAGING  Dg Chest 2 View 01/23/2018 IMPRESSION:  Large amount of chronic pleural thickening and scarring is noted in the right lung base with associated calcifications as described on prior CT scan, most likely due to prior hemothorax or empyema. Stable right apical thickening is noted.    Ct Angio Neck W Or Wo Contrast Ct Angio Head W Or Wo Contrast Ct Cerebral Perfusion W Contrast 01/23/2018 IMPRESSION:  1. No emergent large vessel occlusion or infarct by CT perfusion.  2. Atherosclerosis with up to 50% stenosis at the bilateral ICA clinoid segment, proximal right subclavian artery, and left vertebral origin.  3. No noted ulceration.  4. Right lung opacity seen on scanogram, recommend chest x-ray. (see above)   Mr Brain Wo Contrast  01/23/2018 IMPRESSION:  1. No acute intracranial  process.  2. 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     Ct Head Code Stroke Wo Contrast 01/23/2018 IMPRESSION:  1. No acute finding.ASPECTS is 10.  2. Chronic small vessel ischemia.     Transthoracic Echocardiogram - pending 00/00/00 Study Conclusions  - Left ventricle: The cavity size was normal. Wall thickness was   increased in a pattern of mild LVH. Systolic function was normal.   The estimated ejection fraction was in the range of 55% to 60%.   Wall motion was normal; there were no regional wall motion   abnormalities. Doppler parameters are consistent with abnormal   left ventricular relaxation (grade 1 diastolic dysfunction). - Aortic valve: There was trivial regurgitation. Valve area (VTI):   1.52 cm^2. Valve area (Vmax): 1.67 cm^2. Valve area (Vmean): 1.5   cm^2. - Mitral valve: Valve area by pressure half-time: 1.43 cm^2. - Atrial septum: No defect or patent foramen ovale was identified.    PHYSICAL EXAM Vitals:   01/23/18 2000 01/23/18 2230 01/24/18 0017 01/24/18 0422  BP: (!) 153/74 (!) 145/65 (!) 156/78 136/64  Pulse: 77 78 73 74  Resp: Temp: (!) 97.5 F (36.4 C)  98.2 F (36.8 C) 98.4 F (36.9 C)  TempSrc: Oral  Oral Oral  SpO2:  97% 93% 95%  Weight:      Height:       Frail elderly [place] lady not in distress. . Afebrile. Head is nontraumatic. Neck is supple without bruit.    Cardiac exam no murmur or gallop. Lungs are clear to auscultation. Distal pulses are well felt. Neurological Exam :  Limited partly due to patient's language barrier Patient is alert, she is oriented to her age and hospital but is unable to give the correct month.  Due to language barrier is difficult to have her follow commands but eventually she was able to follow commands.  These were simple commands and not complex commands.  Patient did not show any a aphasia or dysarthria at this time. Cranial Nerves: II:  Initially visual fields were inconsistent however given time she was able to count fingers in all 4 quadrants.  Again this was due to language barrier III,IV, VI: ptosis not present, extra-ocular motions intact bilaterally, pupils equal, round, reactive to light and accommodation V,VII: Right facial droop, facial light touch sensation normal bilaterally VIII: hearing normal bilaterally IX,X: uvula rises symmetrically XI: bilateral shoulder shrug XII: midline tongue extension Motor: Right :  Upper extremity   5/5  Left:     Upper extremity   5/5             Lower extremity   4/5                                                  Lower extremity   4/5 Tone and bulk:normal tone throughout; no atrophy noted Sensory: Decreased on the left, sensory to double simultaneous stimuli was very inconsistent Deep Tendon Reflexes: 2+ and symmetric throughout Plantars: Right: downgoing                                Left: downgoing Cerebellar: Dysmetria on the left finger-to-nose however this may be secondary to weakness, patient did not understand doing heel-to-shin with her left heel over her right shin but with right heel over her left shin she had no difficulty.   Gait: Not tested   ASSESSMENT/PLAN Ms. Timmia Cogburn Archambeault is a 83 y.o. female with history of diabetes mellitus, previous stroke, hypertension, and hyperlipidemia presenting with left-sided weakness and speech difficulties. She did not receive IV t-PA resolution of deficits.  Probable right brainTIA: likely small vessel disease  Resultant  Deficits resolved  CT head  - no acute findings  MRI head - old left basal ganglia infarcts  MRA head - not performed  CTA H&N - 50% stenosis at the bilateral ICA clinoid segment  Carotid Doppler - CTA neck  2D Echo - EF 55-60%. No cardiac source of emboli identified.  LDL - 94  HgbA1c - 6.0  VTE prophylaxis - Lovenox Diet Order           Diet heart  healthy/carb modified Room service appropriate? Yes; Fluid consistency: Thin  Diet effective ____           No antithrombotic prior to admission, now on aspirin 325 mg daily  Patient counseled to be compliant with her antithrombotic medications  Ongoing aggressive stroke risk factor management  Therapy recommendations:  Outpatient physical therapy recommended  Disposition:  Pending  Hypertension  Stable . Permissive hypertension (OK if < 220/120) but gradually normalize in 5-7 days . Long-term BP goal normotensive  Hyperlipidemia  Lipid lowering medication PTA: Lipitor 20 mg daily  LDL 94, goal < 70  Current lipid lowering medication: now on Lipitor 80 mg daily  Continue statin at discharge  Diabetes  HgbA1c 6.0, goal < 7.0  Controlled  Other Stroke Risk Factors  Advanced age  Hx stroke/TIA   Other Active Problems  Hypokalemia - 3.1 -> supplemented - follow-up outpatient  Right lung opacity seen on scanogram - CXR recommended and performed - see above   50% stenosis at the bilateral ICA clinoid segment   Plan / Recommendations   Stroke workup: workup complete  Therapy Follow Up: outpatient physical therapy  Disposition: patient ready for discharge from stroke standpoint  Antiplatelet / Anticoagulation: aspirin 81 mg daily and Plavix 75 mg daily 3 weeks then aspirin 81 mg daily alone  Statin: continue Lipitor  MD Follow Up: [facility] in 6-8 weeks  Other: recheck potassium at follow-up with primary care physician  Further risk factor modification per primary care MD: Follow Up 2 weeks   Hospital day # 0  Delton See PA-C [facility] Pager [phone]  01/24/2018, 3:19 PM I have personally examined this patient, reviewed notes, independently viewed imaging studies, participated in medical decision making and plan of care.ROS completed by me personally and pertinent positives fully documented  I have  made any additions or clarifications directly to the above note. Agree with note above. Patient is a poor historian but apparently has been having some left-sided weakness and had worsening of this. MRI does not show a right brain stroke but does show left brain remote age basal ganglia infarct probably had a small vessel disease TIA. Recommend dual antiplatelet therapy for 3 weeks followed by aspirin alone and aggressive risk factor modification. Long discussion with the patient and family at the midline with speaking friend and family at the bedside and answered questions. Greater than 50% time during this 35 minute visit was spent in counseling and coordination of care about TIA in the corner infarct and answered questions. Discussed with Dr. Carmel Sacramento, MD Medical Director [facility] Pager: [phone] 01/24/2018 4:53 PM   To contact Stroke Continuity provider, please refer to [email or web address]. After hours, contact General Neurology

## 2018-01-24 NOTE — Progress Notes (Signed)
  Echocardiogram 2D Echocardiogram has been performed.  Shannon Coffey 01/24/2018, 11:16 AM

## 2018-01-24 NOTE — Progress Notes (Addendum)
Initial Nutrition Assessment  DOCUMENTATION CODES:   Not applicable  INTERVENTION:  Provide Glucerna Shake po BID, each supplement provides 220 kcal and 10 grams of protein.  Encourage adequate PO intake.   NUTRITION DIAGNOSIS:   Inadequate oral intake related to (poor dentition) as evidenced by per patient/family report.  GOAL:   Patient will meet greater than or equal to 90% of their needs  MONITOR:   PO intake, Supplement acceptance, Labs, Weight trends, I & O's, Skin  REASON FOR ASSESSMENT:   Consult (TIA)  ASSESSMENT:   83 y.o. female with medical history significant of HTN; HLD; and DM presenting with possible CVA.  Family member at bedside. No recent meal completion recorded. RD observed lunch tray during time of visit with <50% intake. Pt reports difficulty consuming food at meals due to no bottom teeth/dentures. RD offered to downgrade diet, however pt and family refused. Pt reports eating well at home. Pt reports weight has been stable. RD to order nutritional supplements to aid in caloric and protein needs.   Labs and medications reviewed.  NUTRITION - FOCUSED PHYSICAL EXAM:    Most Recent Value  Orbital Region  No depletion  Upper Arm Region  No depletion  Thoracic and Lumbar Region  No depletion  Buccal Region  Unable to assess  Temple Region  No depletion  Clavicle Bone Region  No depletion  Clavicle and Acromion Bone Region  No depletion  Scapular Bone Region  Unable to assess  Dorsal Hand  Unable to assess  Patellar Region  No depletion  Anterior Thigh Region  No depletion  Posterior Calf Region  No depletion  Edema (RD Assessment)  None  Hair  Reviewed  Eyes  Reviewed  Mouth  Reviewed  Skin  Reviewed  Nails  Reviewed       Diet Order:   Diet Order           Diet heart healthy/carb modified Room service appropriate? Yes; Fluid consistency: Thin  Diet effective ____          EDUCATION NEEDS:   Not appropriate for education at this  time  Skin:  Skin Assessment: Reviewed RN Assessment  Last BM:  Unknown  Height:   Ht Readings from Last 1 Encounters:  01/23/18 5' (1.524 m)    Weight:   Wt Readings from Last 1 Encounters:  01/23/18 107 lb 12.8 oz (48.9 kg)    Ideal Body Weight:  45.45 kg  BMI:  Body mass index is 21.05 kg/m.  Estimated Nutritional Needs:   Kcal:  1400-1600  Protein:  60-75 grams  Fluid:  >/= 1.5 L/day    Roslyn Smiling, MS, RD, LDN Pager # [phone] After hours/ weekend pager # [phone]

## 2018-01-24 NOTE — Evaluation (Addendum)
Physical Therapy Evaluation Patient Details Name: Shannon Coffey MRN: [id number] DOB: Jun 25, 1935 Today's Date: 01/24/2018   History of Present Illness  Shannon Coffey is a 83 y.o. female with medical history significant of HTN; HLD; and DM presented with speech deficits, AMS, and left sided weakness. MRI negative for acute abnormality, however there are an old LEFT basal ganglia infarcts.  Clinical Impression  Pt admitted with above diagnosis. Pt currently with functional limitations due to the deficits listed below (see PT Problem List). Lives with her son and is independent at baseline. On PT evaluation, patient presenting with decreased functional mobility secondary to mild balance deficits and gait speed. Current gait speed indicates patient is a limited community ambulator but not at high risk for falls. No functional strength differences/deficits noted. Patient will be an excellent candidate for intensive outpatient neuro physical therapy to address deficits and maximize functional independence. Pt will benefit from skilled PT to increase their independence and safety with mobility to allow discharge to the venue listed below.       Follow Up Recommendations Outpatient PT;Supervision for mobility/OOB    Equipment Recommendations  None recommended by PT    Recommendations for Other Services       Precautions / Restrictions Precautions Precautions: Fall Restrictions Weight Bearing Restrictions: No      Mobility  Bed Mobility Overal bed mobility: Modified Independent             General bed mobility comments: increased time to progress to sitting EOB  Transfers Overall transfer level: Needs assistance Equipment used: None Transfers: Sit to/from Stand Sit to Stand: Supervision         General transfer comment: supervision for safety  Ambulation/Gait Ambulation/Gait assistance: Min guard Ambulation Distance (Feet): 350 Feet Assistive device: None Gait  Pattern/deviations: Step-through pattern;Decreased stride length Gait velocity: 1.33 ft/s Gait velocity interpretation: 1.31 - 2.62 ft/sec, indicative of limited community ambulator General Gait Details: Patient overall requiring min guard to steady but no overt LOB. Decreased gait speed and reciprocal arm swing.  Stairs            Wheelchair Mobility    Modified Rankin (Stroke Patients Only) Modified Rankin (Stroke Patients Only) Pre-Morbid Rankin Score: No symptoms Modified Rankin: Moderately severe disability        Balance Overall balance assessment: Needs assistance Sitting-balance support: Feet supported;No upper extremity supported Sitting balance-Leahy Scale: Normal     Standing balance support: No upper extremity supported;During functional activity Standing balance-Leahy Scale: Fair   Single Leg Stance - Right Leg: 5 Single Leg Stance - Left Leg: 5                         Pertinent Vitals/Pain Pain Assessment: No/denies pain    Home Living Family/patient expects to be discharged to:: Private residence Living Arrangements: Children Available Help at Discharge: Family;Available PRN/intermittently Type of Home: House Home Access: Stairs to enter   Entrance Stairs-Number of Steps: 1 Home Layout: Two level;Able to live on main level with bedroom/bathroom   Additional Comments: Son's family is available at night to assist    Prior Function Level of Independence: Independent         Comments: enjoys exercising     Hand Dominance        Extremity/Trunk Assessment   Upper Extremity Assessment Upper Extremity Assessment: Defer to OT evaluation    Lower Extremity Assessment Lower Extremity Assessment: Overall WFL for tasks assessed  Cervical / Trunk Assessment Cervical / Trunk Assessment: Normal  Communication   Communication: Prefers language other than English  Cognition Arousal/Alertness: Awake/alert Behavior During Therapy:  WFL for tasks assessed/performed Overall Cognitive Status: Difficult to assess                                        General Comments General comments (skin integrity, edema, etc.): Son agreeable to interpret, however, does not speak English fluently    Exercises     Assessment/Plan    PT Assessment Patient needs continued PT services  PT Problem List Decreased strength;Decreased balance;Decreased mobility       PT Treatment Interventions      PT Goals (Current goals can be found in the Care Plan section)  Acute Rehab PT Goals Patient Stated Goal: none stated    Frequency Min 4X/week   Barriers to discharge        Co-evaluation               AM-PAC PT "6 Clicks" Daily Activity  Outcome Measure Difficulty turning over in bed (including adjusting bedclothes, sheets and blankets)?: None Difficulty moving from lying on back to sitting on the side of the bed? : A Little Difficulty sitting down on and standing up from a chair with arms (e.g., wheelchair, bedside commode, etc,.)?: A Little Help needed moving to and from a bed to chair (including a wheelchair)?: A Little Help needed walking in hospital room?: A Little Help needed climbing 3-5 steps with a railing? : A Little 6 Click Score: 19    End of Session Equipment Utilized During Treatment: Gait belt Activity Tolerance: Patient tolerated treatment well Patient left: in bed;with call bell/phone within reach;with family/visitor present Nurse Communication: Mobility status PT Visit Diagnosis: Unsteadiness on feet (R26.81);Difficulty in walking, not elsewhere classified (R26.2)    Time: 9604-5409 PT Time Calculation (min) (ACUTE ONLY): 31 min   Charges:   PT Evaluation $PT Eval Low Complexity: 1 Low PT Treatments $Therapeutic Activity: 8-22 mins   PT G Codes:       Laurina Bustle, PT, DPT Acute Rehabilitation Services  Pager: [phone]   Vanetta Mulders 01/24/2018, 11:58 AM

## 2018-01-24 NOTE — Progress Notes (Signed)
Attempted to bring tele interpreter into room to give discharge instruction. Son and patient refused the interpretation services.

## 2018-01-24 NOTE — Discharge Instructions (Signed)
Take Aspirin and Plavix for 3 weeks then aspirin alone.  Follow with Andreas Blowerabeza, Yuri M., MD in 5-7 days  Please get a complete blood count and chemistry panel checked by your Primary MD at your next visit, and again as instructed by your Primary MD. Please get your medications reviewed and adjusted by your Primary MD.  Please request your Primary MD to go over all Hospital Tests and Procedure/Radiological results at the follow up, please get all Hospital records sent to your Prim MD by signing hospital release before you go home.  If you had Pneumonia of Lung problems at the Hospital: Please get a 2 view Chest X ray done in 6-8 weeks after hospital discharge or sooner if instructed by your Primary MD.  If you have Congestive Heart Failure: Please call your Cardiologist or Primary MD anytime you have any of the following symptoms:  1) 3 pound weight gain in 24 hours or 5 pounds in 1 week  2) shortness of breath, with or without a dry hacking cough  3) swelling in the hands, feet or stomach  4) if you have to sleep on extra pillows at night in order to breathe  Follow cardiac low salt diet and 1.5 lit/day fluid restriction.  If you have diabetes Accuchecks 4 times/day, Once in AM empty stomach and then before each meal. Log in all results and show them to your primary doctor at your next visit. If any glucose reading is under 80 or above 300 call your primary MD immediately.  If you have Seizure/Convulsions/Epilepsy: Please do not drive, operate heavy machinery, participate in activities at heights or participate in high speed sports until you have seen by Primary MD or a Neurologist and advised to do so again.  If you had Gastrointestinal Bleeding: Please ask your Primary MD to check a complete blood count within one week of discharge or at your next visit. Your endoscopic/colonoscopic biopsies that are pending at the time of discharge, will also need to followed by your Primary MD.  Get  Medicines reviewed and adjusted. Please take all your medications with you for your next visit with your Primary MD  Please request your Primary MD to go over all hospital tests and procedure/radiological results at the follow up, please ask your Primary MD to get all Hospital records sent to his/her office.  If you experience worsening of your admission symptoms, develop shortness of breath, life threatening emergency, suicidal or homicidal thoughts you must seek medical attention immediately by calling 911 or calling your MD immediately  if symptoms less severe.  You must read complete instructions/literature along with all the possible adverse reactions/side effects for all the Medicines you take and that have been prescribed to you. Take any new Medicines after you have completely understood and accpet all the possible adverse reactions/side effects.   Do not drive or operate heavy machinery when taking Pain medications.   Do not take more than prescribed Pain, Sleep and Anxiety Medications  Special Instructions: If you have smoked or chewed Tobacco  in the last 2 yrs please stop smoking, stop any regular Alcohol  and or any Recreational drug use.  Wear Seat belts while driving.  Please note You were cared for by a hospitalist during your hospital stay. If you have any questions about your discharge medications or the care you received while you were in the hospital after you are discharged, you can call the unit and asked to speak with the hospitalist on call  if the hospitalist that took care of you is not available. Once you are discharged, your primary care physician will handle any further medical issues. Please note that NO REFILLS for any discharge medications will be authorized once you are discharged, as it is imperative that you return to your primary care physician (or establish a relationship with a primary care physician if you do not have one) for your aftercare needs so that they  can reassess your need for medications and monitor your lab values.  You can reach the hospitalist office at phone [phone] or fax [phone]   If you do not have a primary care physician, you can call [phone] for a physician referral.  Activity: As tolerated with Full fall precautions use walker/cane & assistance as needed  Diet: heart healthy  Disposition Home

## 2018-01-24 NOTE — Care Management Obs Status (Signed)
MEDICARE OBSERVATION STATUS NOTIFICATION   Patient Details  Name: Shannon Coffey MRN: [id number] Date of Birth: April 21, 1935   Medicare Observation Status Notification Given:  Yes    Kermit BaloKelli F Enna Warwick, RN 01/24/2018, 4:38 PM

## 2018-01-24 NOTE — Progress Notes (Signed)
Pt and family given discharge instructions, verbalized understanding and able to teach back. Interpreter services refused. Discharge summary signed and in chart. No new concerns.  IV and telemetry removed, patient discharged from unit in wheelchair by staff. Family to drive home.

## 2018-01-24 NOTE — Care Management Note (Signed)
Case Management Note  Patient Details  Name: LORINDA COPLAND MRN: [id number] Date of Birth: 1935/07/03  Subjective/Objective:      Pt admitted with TIA. She is from home with children.              Action/Plan: CM consulted for outpatient therapy. Family would like her to attend at [facility]. Orders in Epic and information on the AVS.  PCP: Dr Luiz Iron Family to provide transportation home.   Expected Discharge Date:  01/24/18               Expected Discharge Plan:  OP Rehab  In-House Referral:     Discharge planning Services  CM Consult  Post Acute Care Choice:    Choice offered to:     DME Arranged:    DME Agency:     HH Arranged:    HH Agency:     Status of Service:  Completed, signed off  If discussed at [employer] of Stay Meetings, dates discussed:    Additional Comments:  Kermit Balo, RN 01/24/2018, 4:39 PM

## 2018-01-24 NOTE — Discharge Summary (Signed)
Physician Discharge Summary  Shannon Coffey:[id number] DOB: Oct 02, 1934 DOA: 01/23/2018  PCP: Andreas Blower., MD  Admit date: 01/23/2018 Discharge date: 01/24/2018  Admitted From: home Disposition:  home  Recommendations for Outpatient Follow-up:  1. Follow up with PCP in 1-2 weeks 2. Follow-up with neurology in 6 weeks 3. To continue aspirin and Plavix for 21 days then aspirin alone  Home Health: None, outpatient PT Equipment/Devices: None  Discharge Condition: Stable CODE STATUS: DNR Diet recommendation: Heart healthy  HPI: Per Dr. Ophelia Charter, Carlyle Lipa Guse is a 83 y.o. female with medical history significant of HTN; HLD; and DM presenting with possible CVA.  This morning, she did not get up like normal.  She slept late.  Her daughter called at 8:30.  She went to the bathroom and felt weak.  When she got back to bed, she laid down.  About 9, she was unable to speak at all.  Her speech was gone for more than an hour.  "I think her memory lose something."  She doesn't seem to understand words as clearly now.  Her left side has been weak for months.  On further questioning, she does have intermittent confusion at home, but perhaps this is worse now.   Hospital Course: TIA -patient was admitted to the hospital with subacute left-sided weakness as well as transient aphasia which lasted about 10 minutes.  Neurology was consulted and followed patient while hospitalized.  She underwent an MRI of the brain which did not show any acute strokes however it did show moderate chronic small vessel ischemic changes and old left basal ganglia infarcts.  CT angios of the head and neck showed no large vessel occlusion but he did show atherosclerosis with up to 50% stenosis at the bilateral ICA clinoid segment, proximal right subclavian artery and left vertebral origin.  2D echo had normal EF and grade 1 diastolic dysfunction.  Her LDL was 94 and her hemoglobin A1c was 6.0.  She is on statin.  Neurology  recommended aspirin and Plavix for 21 days followed by aspirin alone, this was discussed with the patient and the family. HTN -resume home medications HLD -on statin DM -resume home medications  Discharge Diagnoses:  Principal Problem:   TIA (transient ischemic attack) Active Problems:   Essential hypertension   Hyperlipidemia   Type 2 diabetes mellitus without complication (HCC)   Discharge Instructions  Allergies as of 01/24/2018   No Known Allergies     Medication List    TAKE these medications   alendronate 70 MG tablet Commonly known as:  FOSAMAX Take 70 mg by mouth once a week. Tuesdays   aspirin EC 81 MG tablet Take 1 tablet (81 mg total) by mouth daily.   atorvastatin 80 MG tablet Commonly known as:  LIPITOR Take 1 tablet (80 mg total) by mouth daily at 6 PM. What changed:    medication strength  how much to take  when to take this   baclofen 10 MG tablet Commonly known as:  LIORESAL Take 10 mg by mouth 3 (three) times daily.   clopidogrel 75 MG tablet Commonly known as:  PLAVIX Take 1 tablet (75 mg total) by mouth daily for 21 days.   hydrochlorothiazide 12.5 MG tablet Commonly known as:  HYDRODIURIL Take 12.5 mg by mouth daily.   JANUVIA 100 MG tablet Generic drug:  sitaGLIPtin Take 100 mg by mouth daily.   LINZESS 290 MCG Caps capsule Generic drug:  linaclotide Take 1 capsule (290 mcg  total) by mouth daily. What changed:  how much to take      Follow-up Information    Andreas Blower., MD. Schedule an appointment as soon as possible for a visit in 2 week(s).   Specialty:  Internal Medicine Contact information: [address] [place] [address] [phone]           Consultations:  Neurology   Procedures/Studies:  2D echo  Study Conclusions - Left ventricle: The cavity size was normal. Wall thickness was increased in a pattern of mild LVH. Systolic function was normal. The estimated ejection fraction was in  the range of 55% to 60%. Wall motion was normal; there were no regional wall motion abnormalities. Doppler parameters are consistent with abnormal left ventricular relaxation (grade 1 diastolic dysfunction). - Aortic valve: There was trivial regurgitation. Valve area (VTI): 1.52 cm^2. Valve area (Vmax): 1.67 cm^2. Valve area (Vmean): 1.5 cm^2. - Mitral valve: Valve area by pressure half-time: 1.43 cm^2. - Atrial septum: No defect or patent foramen ovale was identified.  Ct Angio Head W Or Wo Contrast  Result Date: 01/23/2018 CLINICAL DATA:  Left-sided weakness EXAM: CT ANGIOGRAPHY HEAD AND NECK CT PERFUSION BRAIN TECHNIQUE: Multidetector CT imaging of the head and neck was performed using the standard protocol during bolus administration of intravenous contrast. Multiplanar CT image reconstructions and MIPs were obtained to evaluate the vascular anatomy. Carotid stenosis measurements (when applicable) are obtained utilizing NASCET criteria, using the distal internal carotid diameter as the denominator. Multiphase CT imaging of the brain was performed following IV bolus contrast injection. Subsequent parametric perfusion maps were calculated using RAPID software. CONTRAST:  Dose not currently available, see chart. COMPARISON:  Noncontrast head CT earlier today FINDINGS: CTA NECK FINDINGS Aortic arch: Atherosclerotic plaque.  No dilatation or dissection. Right carotid system: Atherosclerotic plaque at the common carotid bifurcation primarily affecting the narrowed ECA origin. The ICA is smooth and diffusely patent. Left carotid system: Moderate mainly calcified plaque at the common carotid bifurcation without flow limiting stenosis or ulceration in the proximal ICA. Negative for beading. Vertebral arteries: Subclavian atheromatous narrowing with up to 50% stenosis at the proximal right subclavian. There is moderate (50%) narrowing the origin of the non dominant left vertebral artery. No ulceration or beading.  Skeleton: No acute or aggressive finding Other neck: Negative Upper chest: Scanogram shows coarse interstitial opacity at the right base with probable pleural thickening there is scarring at the right apex with volume loss. Review of the MIP images confirms the above findings CTA HEAD FINDINGS Anterior circulation: No emergent occlusion. Calcified plaque along the carotid siphons with up to 50% stenosis on the at the bilateral clinoid segments (measured on coronal reformats). Mild atheromatous narrowing of bilateral medium size branches. Posterior circulation: Right dominant vertebral artery. Diffusely patent vertebral and basilar arteries. Symmetric PICA and AICA flow. Mild atheromatous narrowing of bilateral posterior cerebral arteries. Negative for aneurysm. Venous sinuses: Patent Anatomic variants: None significant Delayed phase: Not obtained in the emergent setting Review of the MIP images confirms the above findings CT Brain Perfusion Findings: CBF (<30%) Volume: 0mL Perfusion (Tmax>6.0s) volume: 0mL IMPRESSION: 1. No emergent large vessel occlusion or infarct by CT perfusion. 2. Atherosclerosis with up to 50% stenosis at the bilateral ICA clinoid segment, proximal right subclavian artery, and left vertebral origin. 3. No noted ulceration. 4. Right lung opacity seen on scanogram, recommend chest x-ray. Electronically Signed   By: Marnee Spring M.D.   On: 01/23/2018 11:35   Dg  Chest 2 View  Result Date: 01/23/2018 CLINICAL DATA:  Left arm weakness. EXAM: CHEST - 2 VIEW COMPARISON:  CT scan of July 05, 2009. FINDINGS: The heart size and mediastinal contours are within normal limits. No pneumothorax is noted. Left lung is clear. Right apical pleural thickening or scarring is noted as described on prior CT scan. No acute pleural effusion is noted. There is continued presence of large soft tissue density in the right lung base with associated calcifications, consistent with chronic pleural thickening and  scarring and associated calcifications described on prior CT scan, most likely due to prior hemothorax or empyema. The visualized skeletal structures are unremarkable. IMPRESSION: Large amount of chronic pleural thickening and scarring is noted in the right lung base with associated calcifications as described on prior CT scan, most likely due to prior hemothorax or empyema. Stable right apical thickening is noted. Electronically Signed   By: Lupita Raider, M.D.   On: 01/23/2018 12:59   Ct Angio Neck W Or Wo Contrast  Result Date: 01/23/2018 CLINICAL DATA:  Left-sided weakness EXAM: CT ANGIOGRAPHY HEAD AND NECK CT PERFUSION BRAIN TECHNIQUE: Multidetector CT imaging of the head and neck was performed using the standard protocol during bolus administration of intravenous contrast. Multiplanar CT image reconstructions and MIPs were obtained to evaluate the vascular anatomy. Carotid stenosis measurements (when applicable) are obtained utilizing NASCET criteria, using the distal internal carotid diameter as the denominator. Multiphase CT imaging of the brain was performed following IV bolus contrast injection. Subsequent parametric perfusion maps were calculated using RAPID software. CONTRAST:  Dose not currently available, see chart. COMPARISON:  Noncontrast head CT earlier today FINDINGS: CTA NECK FINDINGS Aortic arch: Atherosclerotic plaque.  No dilatation or dissection. Right carotid system: Atherosclerotic plaque at the common carotid bifurcation primarily affecting the narrowed ECA origin. The ICA is smooth and diffusely patent. Left carotid system: Moderate mainly calcified plaque at the common carotid bifurcation without flow limiting stenosis or ulceration in the proximal ICA. Negative for beading. Vertebral arteries: Subclavian atheromatous narrowing with up to 50% stenosis at the proximal right subclavian. There is moderate (50%) narrowing the origin of the non dominant left vertebral artery. No  ulceration or beading. Skeleton: No acute or aggressive finding Other neck: Negative Upper chest: Scanogram shows coarse interstitial opacity at the right base with probable pleural thickening there is scarring at the right apex with volume loss. Review of the MIP images confirms the above findings CTA HEAD FINDINGS Anterior circulation: No emergent occlusion. Calcified plaque along the carotid siphons with up to 50% stenosis on the at the bilateral clinoid segments (measured on coronal reformats). Mild atheromatous narrowing of bilateral medium size branches. Posterior circulation: Right dominant vertebral artery. Diffusely patent vertebral and basilar arteries. Symmetric PICA and AICA flow. Mild atheromatous narrowing of bilateral posterior cerebral arteries. Negative for aneurysm. Venous sinuses: Patent Anatomic variants: None significant Delayed phase: Not obtained in the emergent setting Review of the MIP images confirms the above findings CT Brain Perfusion Findings: CBF (<30%) Volume: 0mL Perfusion (Tmax>6.0s) volume: 0mL IMPRESSION: 1. No emergent large vessel occlusion or infarct by CT perfusion. 2. Atherosclerosis with up to 50% stenosis at the bilateral ICA clinoid segment, proximal right subclavian artery, and left vertebral origin. 3. No noted ulceration. 4. Right lung opacity seen on scanogram, recommend chest x-ray. Electronically Signed   By: Marnee Spring M.D.   On: 01/23/2018 11:35   Mr Brain Wo Contrast  Result Date: 01/23/2018 CLINICAL DATA:  Weakness, aphasia. Suspect  stroke. History of hypertension, hypercholesterolemia and diabetes. EXAM: MRI HEAD WITHOUT CONTRAST TECHNIQUE: Multiplanar, multiecho pulse sequences of the brain and surrounding structures were obtained without intravenous contrast. COMPARISON:  CT HEAD Jan 23, 2018 FINDINGS: INTRACRANIAL CONTENTS: No reduced diffusion to suggest acute ischemia. No susceptibility artifact to suggest hemorrhage. The ventricles and sulci are  normal for patient's age. Patchy to confluent supratentorial white matter FLAIR T2 hyperintensities. Old LEFT basal ganglia small infarcts. No suspicious parenchymal signal, masses, mass effect. Hazy T2 hyperintensities bilateral basal ganglia and thalami associated with chronic small vessel ischemic changes. No abnormal extra-axial fluid collections. No extra-axial masses. VASCULAR: Normal major intracranial vascular flow voids present at skull base. SKULL AND UPPER CERVICAL SPINE: No abnormal sellar expansion. No suspicious calvarial bone marrow signal. Generally bright T1 and T2 bone marrow signal compatible with osteopenia. Craniocervical junction maintained. SINUSES/ORBITS: The mastoid air-cells and included paranasal sinuses are well-aerated.The included ocular globes and orbital contents are non-suspicious. Status post bilateral ocular lens implants. OTHER: Patient is edentulous. IMPRESSION: 1. No acute intracranial process. 2. 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 Electronically Signed   By: Awilda Metroourtnay  Bloomer M.D.   On: 01/23/2018 22:19   Ct Cerebral Perfusion W Contrast  Result Date: 01/23/2018 CLINICAL DATA:  Left-sided weakness EXAM: CT ANGIOGRAPHY HEAD AND NECK CT PERFUSION BRAIN TECHNIQUE: Multidetector CT imaging of the head and neck was performed using the standard protocol during bolus administration of intravenous contrast. Multiplanar CT image reconstructions and MIPs were obtained to evaluate the vascular anatomy. Carotid stenosis measurements (when applicable) are obtained utilizing NASCET criteria, using the distal internal carotid diameter as the denominator. Multiphase CT imaging of the brain was performed following IV bolus contrast injection. Subsequent parametric perfusion maps were calculated using RAPID software. CONTRAST:  Dose not currently available, see chart. COMPARISON:  Noncontrast head CT earlier today FINDINGS: CTA NECK FINDINGS  Aortic arch: Atherosclerotic plaque.  No dilatation or dissection. Right carotid system: Atherosclerotic plaque at the common carotid bifurcation primarily affecting the narrowed ECA origin. The ICA is smooth and diffusely patent. Left carotid system: Moderate mainly calcified plaque at the common carotid bifurcation without flow limiting stenosis or ulceration in the proximal ICA. Negative for beading. Vertebral arteries: Subclavian atheromatous narrowing with up to 50% stenosis at the proximal right subclavian. There is moderate (50%) narrowing the origin of the non dominant left vertebral artery. No ulceration or beading. Skeleton: No acute or aggressive finding Other neck: Negative Upper chest: Scanogram shows coarse interstitial opacity at the right base with probable pleural thickening there is scarring at the right apex with volume loss. Review of the MIP images confirms the above findings CTA HEAD FINDINGS Anterior circulation: No emergent occlusion. Calcified plaque along the carotid siphons with up to 50% stenosis on the at the bilateral clinoid segments (measured on coronal reformats). Mild atheromatous narrowing of bilateral medium size branches. Posterior circulation: Right dominant vertebral artery. Diffusely patent vertebral and basilar arteries. Symmetric PICA and AICA flow. Mild atheromatous narrowing of bilateral posterior cerebral arteries. Negative for aneurysm. Venous sinuses: Patent Anatomic variants: None significant Delayed phase: Not obtained in the emergent setting Review of the MIP images confirms the above findings CT Brain Perfusion Findings: CBF (<30%) Volume: 0mL Perfusion (Tmax>6.0s) volume: 0mL IMPRESSION: 1. No emergent large vessel occlusion or infarct by CT perfusion. 2. Atherosclerosis with up to 50% stenosis at the bilateral ICA clinoid segment, proximal right subclavian artery, and left vertebral origin. 3. No noted ulceration. 4.  Right lung opacity seen on scanogram, recommend  chest x-ray. Electronically Signed   By: Marnee Spring M.D.   On: 01/23/2018 11:35   Ct Head Code Stroke Wo Contrast  Result Date: 01/23/2018 CLINICAL DATA:  Code stroke.  Left-sided weakness EXAM: CT HEAD WITHOUT CONTRAST TECHNIQUE: Contiguous axial images were obtained from the base of the skull through the vertex without intravenous contrast. COMPARISON:  None. FINDINGS: Brain: No evidence of acute infarction, hemorrhage, hydrocephalus, extra-axial collection or mass lesion/mass effect. Low-density in the cerebral white matter attributed to chronic small vessel ischemia. Lacunar infarcts seen in the left putamen and caudate, age-indeterminate but not contralateral to the symptomatic side Vascular: Atherosclerotic calcification.  No hyperdense vessel. Skull: No acute finding Sinuses/Orbits: No acute finding. Other: These results were communicated to Dr. Amada Jupiter at 11:22 amon 5/30/2019by text page via the [facility] messaging system. ASPECTS [facility] Stroke Program Early CT Score) - Ganglionic level infarction (caudate, lentiform nuclei, internal capsule, insula, M1-M3 cortex): 7 - Supraganglionic infarction (M4-M6 cortex): 3 Total score (0-10 with 10 being normal): 10 IMPRESSION: 1. No acute finding.ASPECTS is 10. 2. Chronic small vessel ischemia. Electronically Signed   By: Marnee Spring M.D.   On: 01/23/2018 11:24     Subjective: - no chest pain, shortness of breath, no abdominal pain, nausea or vomiting.   Discharge Exam: Vitals:   01/24/18 0803 01/24/18 1119  BP: (!) 142/62 135/63  Pulse: 72 74  Resp: 16   Temp: 98.4 F (36.9 C) 99 F (37.2 C)  SpO2: 96% 92%    General: Pt is alert, awake, not in acute distress Cardiovascular: RRR, S1/S2 +, no rubs, no gallops Respiratory: CTA bilaterally, no wheezing, no rhonchi Abdominal: Soft, NT, ND, bowel sounds + Extremities: no edema, no cyanosis    The results of significant diagnostics from this hospitalization (including imaging,  microbiology, ancillary and laboratory) are listed below for reference.     Microbiology: No results found for this or any previous visit (from the past 240 hour(s)).   Labs: BNP (last 3 results) No results for input(s): BNP in the last 8760 hours. Basic Metabolic Panel: Recent Labs  Lab 01/23/18 1041 01/23/18 1045  NA 137 138  K 3.2* 3.1*  CL 100* 98*  CO2 25  --   GLUCOSE 145* 149*  BUN 11 11  CREATININE 0.61 0.50  CALCIUM 8.9  --    Liver Function Tests: Recent Labs  Lab 01/23/18 1041  AST 26  ALT 20  ALKPHOS 59  BILITOT 1.0  PROT 6.7  ALBUMIN 3.7   No results for input(s): LIPASE, AMYLASE in the last 168 hours. No results for input(s): AMMONIA in the last 168 hours. CBC: Recent Labs  Lab 01/23/18 1041 01/23/18 1045  WBC 6.6  --   NEUTROABS 4.2  --   HGB 12.2 12.9  HCT 37.0 38.0  MCV 73.7*  --   PLT 192  --    Cardiac Enzymes: No results for input(s): CKTOTAL, CKMB, CKMBINDEX, TROPONINI in the last 168 hours. BNP: Invalid input(s): POCBNP CBG: Recent Labs  Lab 01/23/18 1625 01/23/18 2231 01/24/18 0631 01/24/18 1117  GLUCAP 139* 193* 103* 135*   D-Dimer No results for input(s): DDIMER in the last 72 hours. Hgb A1c Recent Labs    01/23/18 1535  HGBA1C 6.0*   Lipid Profile Recent Labs    01/24/18 0441  CHOL 174  HDL 61  LDLCALC 94  TRIG 97  CHOLHDL 2.9   Thyroid function studies Recent Labs  01/23/18 1535  TSH 0.541   Anemia work up No results for input(s): VITAMINB12, FOLATE, FERRITIN, TIBC, IRON, RETICCTPCT in the last 72 hours. Urinalysis    Component Value Date/Time   COLORURINE COLORLESS (A) 01/23/2018 1149   APPEARANCEUR CLEAR 01/23/2018 1149   LABSPEC 1.019 01/23/2018 1149   PHURINE 8.0 01/23/2018 1149   GLUCOSEU 150 (A) 01/23/2018 1149   HGBUR NEGATIVE 01/23/2018 1149   BILIRUBINUR NEGATIVE 01/23/2018 1149   KETONESUR NEGATIVE 01/23/2018 1149   PROTEINUR NEGATIVE 01/23/2018 1149   NITRITE NEGATIVE 01/23/2018  1149   LEUKOCYTESUR NEGATIVE 01/23/2018 1149   Sepsis Labs Invalid input(s): PROCALCITONIN,  WBC,  LACTICIDVEN   Time coordinating discharge: 35 minutes  SIGNED:  Pamella Pert, MD  [facility] 01/24/2018, 4:33 PM Pager [phone]  If 7PM-7AM, please contact night-coverage [email or web address] Password [email or web address]

## 2018-01-24 NOTE — Evaluation (Signed)
Speech Language Pathology Evaluation Patient Details Name: Shannon Coffey MRN: [id number] DOB: 1935/05/05 Today's Date: 01/24/2018 Time: [id number] SLP Time Calculation (min) (ACUTE ONLY): 17 min  Problem List:  Patient Active Problem List   Diagnosis Date Noted  . TIA (transient ischemic attack) 01/23/2018  . Essential hypertension 01/23/2018  . Hyperlipidemia 01/23/2018  . Type 2 diabetes mellitus without complication (HCC) 01/23/2018   Past Medical History:  Past Medical History:  Diagnosis Date  . High cholesterol   . Hypertension   . TIA (transient ischemic attack) 01/23/2018   possibly/notes 01/23/2018  . Type II diabetes mellitus (HCC)    Past Surgical History:  Past Surgical History:  Procedure Laterality Date  . CAPSULOTOMY Right 2006   YAG CAPSULOTOMY Hattie Perch/notes 05/28/2017  . CATARACT EXTRACTION W/ INTRAOCULAR LENS  IMPLANT, BILATERAL Bilateral    Hattie Perch/notes 05/28/2017  . EYE SURGERY    . PTERYGIUM EXCISION     Hattie Perch/notes 05/28/2017   HPI:  Shannon Coffey is a 83 y.o. female with medical history significant of HTN; HLD; and DM presented with speech deficits, AMS, and left sided weakness. MRI negative for acute abnormality, however there are an old LEFT basal ganglia infarcts.   Assessment / Plan / Recommendation Clinical Impression  Assessment was somewhat limited by language barrier, as pt/family declined use of interpreter, preferring to use her son to assist with translation. She seems to follow commands well and respond to questions appropriately. She remembers basic information about daily events, but is not sure why she is in the hospital. Pt's son and daughter shared that she has still been having some difficulty with memory and confusion, but that this is also present at baseline. Therefore, they decline SLP f/u. Encouraged additional supervision upon initial return home to monitor for safety. No further acute needs identified.    SLP Assessment  SLP Recommendation/Assessment:  Patient does not need any further Speech Lanaguage Pathology Services SLP Visit Diagnosis: Cognitive communication deficit (R41.841)    Follow Up Recommendations  24 hour supervision/assistance(upon initial return home, then intermittent)    Frequency and Duration           SLP Evaluation Cognition  Overall Cognitive Status: History of cognitive impairments - at baseline       Comprehension  Auditory Comprehension Overall Auditory Comprehension: Appears within functional limits for tasks assessed    Expression Expression Primary Mode of Expression: Verbal Verbal Expression Overall Verbal Expression: Appears within functional limits for tasks assessed   Oral / Motor  Motor Speech Overall Motor Speech: Appears within functional limits for tasks assessed   GO                    Shannon Coffey, Shannon Coffey 01/24/2018, 5:25 PM  Shannon HamLaura Paiewonsky, M.A. CCC-SLP [phone]

## 2018-01-24 NOTE — Progress Notes (Signed)
OT Cancellation Note  Patient Details Name: Shannon Coffey MRN: [id number] DOB: [birth year]   Cancelled Treatment:    Reason Eval/Treat Not Completed: Patient at procedure or test/ unavailable(Echo). 2nd attempt today - Pt has been off the floor at procedure twice. Will attempt evaluation tomorrow.   Evern BioLaura J Carianne Taira 01/24/2018, 4:13 PM  Sherryl MangesLaura Abria Vannostrand OTR/L [phone]

## 2018-02-06 ENCOUNTER — Ambulatory Visit: Payer: Medicare Other | Admitting: Physical Therapy

## 2018-02-12 ENCOUNTER — Other Ambulatory Visit: Payer: Self-pay

## 2018-02-12 ENCOUNTER — Ambulatory Visit: Payer: Medicare Other | Attending: Internal Medicine | Admitting: Physical Therapy

## 2018-02-12 ENCOUNTER — Encounter: Payer: Self-pay | Admitting: Physical Therapy

## 2018-02-12 DIAGNOSIS — M25612 Stiffness of left shoulder, not elsewhere classified: Secondary | ICD-10-CM

## 2018-02-12 DIAGNOSIS — M6281 Muscle weakness (generalized): Secondary | ICD-10-CM | POA: Diagnosis present

## 2018-02-12 DIAGNOSIS — M79602 Pain in left arm: Secondary | ICD-10-CM | POA: Diagnosis not present

## 2018-02-12 DIAGNOSIS — R262 Difficulty in walking, not elsewhere classified: Secondary | ICD-10-CM | POA: Diagnosis present

## 2018-02-12 DIAGNOSIS — R2681 Unsteadiness on feet: Secondary | ICD-10-CM | POA: Diagnosis present

## 2018-02-12 NOTE — Therapy (Signed)
[facility] [place] [address]  [address] [place], [place], [address] Phone: [phone]   Fax:  [phone]  Physical Therapy Evaluation  Patient Details  Name: Shannon Coffey MRN: [id number] Date of Birth: [birth year] Referring Provider: Pamella Pert, MD   Encounter Date: 02/12/2018  PT End of Session - 02/12/18 1550    Visit Number  1    Number of Visits  17    Date for PT Re-Evaluation  04/09/18    Authorization Type  [employer] Medicare    PT Start Time  1437    PT Stop Time  1535    PT Time Calculation (min)  58 min    Activity Tolerance  Patient tolerated treatment well;Patient limited by pain    Behavior During Therapy  [facility] for tasks assessed/performed       Past Medical History:  Diagnosis Date  . High cholesterol   . Hypertension   . TIA (transient ischemic attack) 01/23/2018   possibly/notes 01/23/2018  . Type II diabetes mellitus (HCC)     Past Surgical History:  Procedure Laterality Date  . CAPSULOTOMY Right 2006   YAG CAPSULOTOMY Hattie Perch 05/28/2017  . CATARACT EXTRACTION W/ INTRAOCULAR LENS  IMPLANT, BILATERAL Bilateral    Hattie Perch 05/28/2017  . EYE SURGERY    . PTERYGIUM EXCISION     Hattie Perch 05/28/2017    There were no vitals filed for this visit.   Subjective Assessment - 02/12/18 1546    Subjective  Patient reports she was hospitalized with stroke on 01/23/18, D/C'd 01/24/18. Reports she could not talk, and that is what send her to the ED. Current symptoms: can talk however slightly paralyzed on L side, feels pain every time she lifts L arm, almost continuous dizziness. Had trouble with dizziness before stroke, and has been taking meds before the stroke for vertigo. Denies symptoms in L LE but reports she has to walk slower than before stroke.  Pain radiates from L shoulder down to L arm. Denies N/T.  Functional deficits include: difficulty lifting L arm past 90 degrees, walking.    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  Yes    Pain Score  8     Pain Location  Arm    Pain Orientation  Left    Pain Type  Acute pain    Aggravating Factors   lifting arm    Pain Relieving Factors  none         [facility] PT Assessment - 02/12/18 1455      Assessment   Medical Diagnosis  TIA    Referring Provider  Pamella Pert, MD    Onset Date/Surgical Date  01/23/18    Hand Dominance  Right    Next MD Visit  Unknown    Prior Therapy  No      Precautions   Precautions  None      Restrictions   Weight Bearing Restrictions  No  Balance Screen   Has the patient fallen in the past 6 months  No    Has the patient had a decrease in activity level because of a fear of falling?   No    Is the patient reluctant to leave their home because of a fear of falling?   No      Home Environment   Living Environment  Private residence    Living Arrangements  Children    Available Help at Discharge  Family    Type of Home  House    Home Access  Level entry    Home Layout  Two level stays on 1st level    Alternate Level Stairs-Number of Steps  13    Alternate Level Stairs-Rails  None    Home Equipment  [place] - single point      Prior Function   Level of Independence  Independent    Vocation  Retired    Leisure  hangout with friends      Cognition   Overall Cognitive Status  Difficult to assess language barrier      Sensation   Light Touch  Appears Intact      Coordination    Gross Motor Movements are Fluid and Coordinated  No      Posture/Postural Control   Posture/Postural Control  Postural limitations    Postural Limitations  Rounded Shoulders;Forward head      ROM / Strength   AROM / PROM / Strength  Strength;AROM;PROM      AROM   AROM Assessment Site  Shoulder    Right/Left Shoulder  Left;Right    Right Shoulder Flexion  155 Degrees    Right Shoulder ABduction  180 Degrees    Right Shoulder Internal Rotation  60 Degrees    Right Shoulder External Rotation  90 Degrees    Left Shoulder Flexion  53 Degrees    Left Shoulder ABduction  45 Degrees      PROM   PROM Assessment Site  Shoulder    Right/Left Shoulder  Left    Left Shoulder Flexion  82 Degrees    Left Shoulder ABduction  80 Degrees flexor synergy      Strength   Strength Assessment Site  Elbow    Right/Left Elbow  Left;Right    Right Elbow Flexion  4+/5    Right Elbow Extension  4+/5    Left Elbow Flexion  3+/5 pain in forearm    Left Elbow Extension  3+/5 pain in forearm      Palpation   Palpation comment  no TTP along length of L arm      Ambulation/Gait   Ambulation/Gait  Yes    Ambulation/Gait Assistance  6: Modified independent (Device/Increase time)    Assistive device  None    Gait Pattern  Decreased arm swing - right;Decreased arm swing - left;Step-through pattern;Shuffle;Decreased step length - right;Decreased step length - left    Ambulation Surface  Level;Indoor    Gait velocity  severely decreased      Balance   Balance Assessed  Yes      Static Standing Balance   Static Standing - Balance Support  No upper extremity supported    Static Standing - Level of Assistance  5: Stand by assistance    Static Standing Balance -  Activities   Romberg - Eyes Opened;Romberg - Eyes Closed;Romberg - Eyes Closed , Foam;Romberg - Eyes Opened, Foam    Static Standing - Comment/# of Minutes  severe sway with EC/firm and EC/foam      Standardized Balance Assessment   Standardized  Balance Assessment  Timed Up and Go Test 35.4 seconds without AD                Objective measurements completed on examination: See above findings.              PT Education - 02/12/18 1542    Education Details  Prognosis, POC, HEP    Person(s) Educated  Patient;Other (comment) interpreter, family friend    Methods  Explanation;Demonstration;Tactile cues;Verbal cues;Handout    Comprehension  Verbalized understanding;Returned demonstration       PT Short Term Goals - 02/12/18 1559      PT SHORT TERM GOAL #1   Title  Patient to be independent with initial HEP.    Time  4    Period  Weeks    Status  New    Target Date  03/12/18        PT Long Term Goals - 02/12/18 1559      PT LONG TERM GOAL #1   Title  Patient to be independent with advanced HEP.    Time  8    Period  Weeks    Status  New    Target Date  04/09/18      PT LONG TERM GOAL #2   Title  Patient to demonstrate [facility] L shoulder AROM with <=2/10 pain.    Time  8    Period  Weeks    Status  New    Target Date  04/09/18      PT LONG TERM GOAL #3   Title  Patient to demonstrate >=4+/5 strength in L UE.    Time  8    Period  Weeks    Status  New    Target Date  04/09/18      PT LONG TERM GOAL #4   Title  Patient to demonstrate TUG of <13.5 seconds with LRAD in order to decrease risk of falls.    Time  8    Period  Weeks    Status  New    Target Date  04/09/18      PT LONG TERM GOAL #5   Title  Patient to demonstrate mild sway with EC/foam surface in order to improve ability to maneuver in dim lighting and uneven surfaces.    Time  8    Period  Weeks    Status  New    Target Date  04/09/18             Plan - 02/12/18 1551    Clinical Impression Statement  Patient is an 82y/o F arriving to OPPT with family friend and interpreter with c/o L arm pain and imbalance after hospitalization for stroke on 01/23/18. Patient reporting that she had aphasia at stroke onset, but now does  not have trouble talking. Current symptoms include: feeling slightly paralyzed in L UE, pain every time she lifts L arm, and having to walk much more slowly than pre-stroke. Patient also with prior history of vertigo. Measurements limited today d/t language barrier and patient's decreased cognition. Presents with decreased L UE strength and ROM, increased TUG time, and imbalance with M-CTSIB. Patient, family friend, and interpreter educated on HEP for balance at counter top for safety and shoulder AAROM with cane. Patient also advised to start using the cane that she owns for walking in order to decrease risk of falls.  Patient reported understanding.    Clinical Presentation  Stable    Clinical Decision Making  Low    Rehab Potential  Good    PT Frequency  2x / week    PT Duration  8 weeks    PT Treatment/Interventions  ADLs/Self Care Home Management;Cryotherapy;Moist Heat;DME Instruction;Gait training;Stair training;Functional mobility training;Therapeutic activities;Therapeutic exercise;Manual techniques;Patient/family education;Cognitive remediation;Neuromuscular re-education;Balance training;Passive range of motion;Energy conservation;Splinting;Taping    PT Next Visit Plan  balance and transfer training, gait training with cane    Consulted and Agree with Plan of Care  Patient;Family member/caregiver    Family Member Consulted  family friend- with consent from patient       Patient will benefit from skilled therapeutic intervention in order to improve the following deficits and impairments:  Decreased endurance, Decreased activity tolerance, Decreased strength, Impaired UE functional use, Pain, Difficulty walking, Decreased mobility, Decreased balance, Decreased range of motion, Decreased cognition, Dizziness, Decreased coordination, Postural dysfunction  Visit Diagnosis: Pain in left arm  Stiffness of left shoulder, not elsewhere classified  Muscle weakness (generalized)  Difficulty in  walking, not elsewhere classified  Unsteadiness on feet     Problem List Patient Active Problem List   Diagnosis Date Noted  . TIA (transient ischemic attack) 01/23/2018  . Essential hypertension 01/23/2018  . Hyperlipidemia 01/23/2018  . Type 2 diabetes mellitus without complication (HCC) 01/23/2018     Anette Guarneri, PT, DPT 02/12/18 4:07 PM   [facility] [address]  [address] [place], [place], [address] Phone: [phone]   Fax:  [phone]  Name: Shannon Coffey MRN: [id number] Date of Birth: 1935/06/14

## 2018-02-17 ENCOUNTER — Encounter: Payer: Self-pay | Admitting: Physical Therapy

## 2018-02-17 ENCOUNTER — Ambulatory Visit: Payer: Medicare Other | Admitting: Physical Therapy

## 2018-02-17 ENCOUNTER — Ambulatory Visit: Payer: Medicare Other

## 2018-02-17 DIAGNOSIS — M79602 Pain in left arm: Secondary | ICD-10-CM

## 2018-02-17 DIAGNOSIS — M6281 Muscle weakness (generalized): Secondary | ICD-10-CM

## 2018-02-17 DIAGNOSIS — R262 Difficulty in walking, not elsewhere classified: Secondary | ICD-10-CM

## 2018-02-17 DIAGNOSIS — M25612 Stiffness of left shoulder, not elsewhere classified: Secondary | ICD-10-CM

## 2018-02-17 DIAGNOSIS — R2681 Unsteadiness on feet: Secondary | ICD-10-CM

## 2018-02-17 NOTE — Therapy (Signed)
[facility] [address]  [address] [place], [place], [address] Phone: [phone]   Fax:  [phone]  Physical Therapy Treatment  Patient Details  Name: Shannon Coffey MRN: [id number] Date of Birth: [birth year] Referring Provider: Pamella Pert, MD   Encounter Date: 02/17/2018  PT End of Session - 02/17/18 1158    Visit Number  2    Number of Visits  17    Date for PT Re-Evaluation  04/09/18    Authorization Type  [employer]HC Medicare    PT Start Time  1003    PT Stop Time  1049    PT Time Calculation (min)  46 min    Equipment Utilized During Treatment  Gait belt    Activity Tolerance  Patient tolerated treatment well;Patient limited by pain    Behavior During Therapy  WFL for tasks assessed/performed       Past Medical History:  Diagnosis Date  . High cholesterol   . Hypertension   . TIA (transient ischemic attack) 01/23/2018   possibly/notes 01/23/2018  . Type II diabetes mellitus (HCC)     Past Surgical History:  Procedure Laterality Date  . CAPSULOTOMY Right 2006   YAG CAPSULOTOMY Hattie Perch 05/28/2017  . CATARACT EXTRACTION W/ INTRAOCULAR LENS  IMPLANT, BILATERAL Bilateral    Hattie Perch 05/28/2017  . EYE SURGERY    . PTERYGIUM EXCISION     Hattie Perch 05/28/2017    There were no vitals filed for this visit.  Subjective Assessment - 02/17/18 1019    Subjective  Patient reports she has not been using cane at home and does not go out in the community. Has a walking stick at home. Reports compliance with HEP. Patient reports she has had L arm pain of a couple months duration and has been getting worse- had this pain before she went to the hospital/ED for stroke.     Patient is accompained by:  Interpreter interpreter and family friend    Diagnostic tests  01/23/18: CT head showed no acute finding, CT angio head/neck showed no emergent large vessel occlusion or infarct but 50% stenosis of B ICA, R subclavian, L vertebral, chest xray  showed chronic pleural thickening of R liung base "most likely due to prior hemothorax or empyema," MRI brain showed no acute intracranial process but moderate chronic small vessel ischemic changes and old LEFT basal ganglia infarcts    Currently in Pain?  Yes    Pain Score  -- patient unable to rate    Pain Location  Back                       [facility] Adult PT Treatment/Exercise - 02/17/18 0001      Ambulation/Gait   Ambulation/Gait  Yes    Ambulation/Gait Assistance  6: Modified independent (Device/Increase time)    Ambulation Distance (Feet)  180 Feet    Assistive device  Straight cane    Gait Pattern  Step-through pattern;Decreased step length - left;Decreased step length - right    Ambulation Surface  Level;Indoor    Gait velocity  decreased    Gait Comments  patient requiring heavy VC/TCs to encourage reciprocal use of SPC- decreased step length on B LEs but overall improved compared to gait without cane      Exercises   Exercises  Shoulder;Knee/Hip      Knee/Hip Exercises: Standing   Forward Step Up  Left;Right;1 set;10 reps;Hand Hold: 0 on airex with SPC  and CGA    SLS with Vectors  10x each LE with SPC and CGA    Other Standing Knee Exercises  sidestepping 20x each way without UE support      Knee/Hip Exercises: Seated   Sit to Sand  1 set;10 reps;with UE support 5x firm, 5x on foam      Shoulder Exercises: Supine   External Rotation  AAROM;Left;5 reps with wand; IR/ER    Flexion  AAROM;Left;10 reps with wand    ABduction  AAROM;Left;10 reps with wand; c/o pain at end range      Shoulder Exercises: Seated   Diagonals  Left;AROM;10 reps patient with c/o pain in L arm- discontinued    Other Seated Exercises  L UE WBing with push back up; 10x TCs to guide movement               PT Short Term Goals - 02/17/18 1206      PT SHORT TERM GOAL #1   Title  Patient to be independent with initial HEP.    Time  4    Period  Weeks    Status  Achieved         PT Long Term Goals - 02/12/18 1559      PT LONG TERM GOAL #1   Title  Patient to be independent with advanced HEP.    Time  8    Period  Weeks    Status  New    Target Date  04/09/18      PT LONG TERM GOAL #2   Title  Patient to demonstrate [facility] L shoulder AROM with <=2/10 pain.    Time  8    Period  Weeks    Status  New    Target Date  04/09/18      PT LONG TERM GOAL #3   Title  Patient to demonstrate >=4+/5 strength in L UE.    Time  8    Period  Weeks    Status  New    Target Date  04/09/18      PT LONG TERM GOAL #4   Title  Patient to demonstrate TUG of <13.5 seconds with LRAD in order to decrease risk of falls.    Time  8    Period  Weeks    Status  New    Target Date  04/09/18      PT LONG TERM GOAL #5   Title  Patient to demonstrate mild sway with EC/foam surface in order to improve ability to maneuver in dim lighting and uneven surfaces.    Time  8    Period  Weeks    Status  New    Target Date  04/09/18            Plan - 02/17/18 1159    Clinical Impression Statement  Patient arrived to session with family friend and no AD- used video interpreting for session. Patient reporting compliance with HEP. Patient requiring heavy VC/TCs to encourage reciprocal use of SPC- decreased step length on B LEs but overall improved compared to gait without cane. Advised patient to use cane for ambulating in community; patient reporting she only owns walking stick but will ask her children to get her a cane because she feels more stable with it. Patient tolerated all balance training activities without LOB- some unsteadiness with step ups on foam. Patient still with c/o L arm pain that is "deep in the arm" and reporting its onset  before hospitalization for stroke. Tolerated L UE WBing with push up to sit without problem- TCs to guide movement. Attempted D2 flexion with L UE- discontinued d/t pain. Encouraged patient to continue AAROM for L UE at home. Patient agreeable.      PT Treatment/Interventions  ADLs/Self Care Home Management;Cryotherapy;Moist Heat;DME Instruction;Gait training;Stair training;Functional mobility training;Therapeutic activities;Therapeutic exercise;Manual techniques;Patient/family education;Cognitive remediation;Neuromuscular re-education;Balance training;Passive range of motion;Energy conservation;Splinting;Taping    PT Next Visit Plan  balance training, L UE WBing exercises    Consulted and Agree with Plan of Care  Patient;Family member/caregiver family friend    Family Member Consulted  family friend- with consent from patient       Patient will benefit from skilled therapeutic intervention in order to improve the following deficits and impairments:  Decreased endurance, Decreased activity tolerance, Decreased strength, Impaired UE functional use, Pain, Difficulty walking, Decreased mobility, Decreased balance, Decreased range of motion, Decreased cognition, Dizziness, Decreased coordination, Postural dysfunction  Visit Diagnosis: Pain in left arm  Stiffness of left shoulder, not elsewhere classified  Muscle weakness (generalized)  Difficulty in walking, not elsewhere classified  Unsteadiness on feet     Problem List Patient Active Problem List   Diagnosis Date Noted  . TIA (transient ischemic attack) 01/23/2018  . Essential hypertension 01/23/2018  . Hyperlipidemia 01/23/2018  . Type 2 diabetes mellitus without complication (HCC) 01/23/2018    Anette GuarneriYevgeniya Milla Wahlberg, PT, DPT 02/17/18 12:08 PM    [facility] [place] [address]  [address] [place], [place], [address] Phone: [phone]   Fax:  [phone]  Name: Shannon Coffey MRN: [id number] Date of Birth: 01/30/1935

## 2018-02-19 ENCOUNTER — Ambulatory Visit: Payer: Medicare Other

## 2018-02-19 DIAGNOSIS — M79602 Pain in left arm: Secondary | ICD-10-CM

## 2018-02-19 DIAGNOSIS — M6281 Muscle weakness (generalized): Secondary | ICD-10-CM

## 2018-02-19 DIAGNOSIS — M25612 Stiffness of left shoulder, not elsewhere classified: Secondary | ICD-10-CM

## 2018-02-19 DIAGNOSIS — R262 Difficulty in walking, not elsewhere classified: Secondary | ICD-10-CM

## 2018-02-19 DIAGNOSIS — R2681 Unsteadiness on feet: Secondary | ICD-10-CM

## 2018-02-19 NOTE — Therapy (Addendum)
[facility] [address]  [place], [place], [address] Phone: [phone]   Fax:  [phone]  Physical Therapy Treatment  Patient Details  Name: Shannon Coffey MRN: [id number] Date of Birth: [birth year] Referring Provider: Marzetta Board, MD   Encounter Date: 02/19/2018  PT End of Session - 02/19/18 1329    Visit Number  3    Number of Visits  17    Date for PT Re-Evaluation  04/09/18    Authorization Type  [employer] Medicare    PT Start Time  1320    PT Stop Time  1400    PT Time Calculation (min)  40 min    Equipment Utilized During Treatment  Gait belt    Activity Tolerance  Patient tolerated treatment well;Patient limited by pain    Behavior During Therapy  [facility] for tasks assessed/performed       Past Medical History:  Diagnosis Date  . High cholesterol   . Hypertension   . TIA (transient ischemic attack) 01/23/2018   possibly/notes 01/23/2018  . Type II diabetes mellitus ([place])     Past Surgical History:  Procedure Laterality Date  . CAPSULOTOMY Right 2006   YAG CAPSULOTOMY Archie Endo 05/28/2017  . CATARACT EXTRACTION W/ INTRAOCULAR LENS  IMPLANT, BILATERAL Bilateral    Archie Endo 05/28/2017  . EYE SURGERY    . PTERYGIUM EXCISION     Archie Endo 05/28/2017    There were no vitals filed for this visit.  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  No/denies    Pain Score  0-No pain    Multiple Pain Sites  No                        [facility] Adult PT Treatment/Exercise - 02/19/18 1340      Ambulation/Gait   Ambulation/Gait  Yes    Ambulation/Gait Assistance  6: Modified independent (Device/Increase time)    Ambulation Distance (Feet)  50 Feet    Assistive device  Straight cane    Gait Pattern  Step-through pattern;Decreased step length - left;Decreased step length - right    Ambulation Surface  Level;Indoor    Gait Comments  Pt. requiring near constant cueing for proper sequencing however did have improved carryover with SPC in R UE following gait training       Self-Care   Self-Care  Other Self-Care Comments    Other Self-Care Comments   Instruction of current shoulder AAROM HEP with translation via Google Translate of HEP instructions       Knee/Hip Exercises: Aerobic   Nustep  Lvl 1, 6 min (UE/LE      Knee/Hip Exercises: Standing   Other Standing Knee Exercises  Alternating L UE cone stack at counter top with L > R reaching and back 4 x 7 cones; focused on improving L UE muscular activation and awareness  well tolerated       Knee/Hip Exercises: Seated  Sit to Sand  1 set;10 reps;with UE support L UE pushoff from mat table       Shoulder Exercises: Supine   External Rotation  AAROM;Left;10 reps Cueing required for proper motion and pacing     External Rotation Limitations  want    Flexion  AAROM;Left;10 reps Cueing required for proper motion and pacing     Flexion Limitations  wand    ABduction  AAROM;Left;10 reps Cueing required for proper motion and pacing     ABduction Limitations  wand             PT Education - 02/19/18 1829    Education Details  RE-issued shoulder AAROM HEP as pt. reporting she has not performed as she is confused by pictures and instructions     Person(s) Educated  Patient    Methods  Explanation;Demonstration;Verbal cues;Handout    Comprehension  Verbalized understanding;Returned demonstration;Verbal cues required;Need further instruction        PT Short Term Goals - 02/17/18 1206      PT SHORT TERM GOAL #1   Title  Patient to be independent with initial HEP.    Time  4    Period  Weeks    Status  Achieved        PT Long Term Goals - 02/19/18 1330      PT LONG TERM GOAL #1   Title  Patient to be independent with advanced HEP.    Time  8    Period  Weeks    Status  On-going      PT LONG TERM GOAL #2   Title  Patient to demonstrate WFL L shoulder AROM with <=2/10 pain.    Time  8    Period  Weeks    Status  On-going      PT LONG TERM GOAL #3   Title  Patient to demonstrate >=4+/5 strength in L UE.    Time  8    Period  Weeks    Status  On-going      PT LONG TERM GOAL #4   Title  Patient to demonstrate TUG of <13.5 seconds with LRAD in order to decrease risk of falls.    Time  8    Period  Weeks    Status  On-going      PT LONG TERM GOAL #5   Title  Patient to demonstrate mild sway with EC/foam surface in order to improve ability to maneuver in dim lighting and uneven surfaces.    Time  8    Period  Weeks    Status  On-going            Plan - 02/19/18 1331    Clinical Impression Statement  Pt. reporting, via video interpreter that she has not been performing shoulder AAROM HEP as she is confused as to how to perform.  HEP re-issued to pt. today and reviewed proper demonstration of each activity with pt. able to demo good understanding.  Reviewed proper use of SPC with gait training in R UE with pt. requiring heavy cueing however some carryover following.  Pt. did not bring her own [facility] into session today.  Duration of session focused on activities to promote L UE weight bearing and awareness/coordination.  Pt. tolerated all activities in session well today.    PT Treatment/Interventions  ADLs/Self Care Home Management;Cryotherapy;Moist Heat;DME Instruction;Gait training;Stair training;Functional mobility training;Therapeutic activities;Therapeutic exercise;Manual techniques;Patient/family  education;Cognitive remediation;Neuromuscular re-education;Balance training;Passive range of motion;Energy conservation;Splinting;Taping  PT Next Visit Plan  balance training, L UE WBing exercises    Consulted and Agree with Plan of Care  Patient;Family member/caregiver family friend     Family Member Consulted  family friend- with consent from patient       Patient will benefit from skilled therapeutic intervention in order to improve the following deficits and impairments:  Decreased endurance, Decreased activity tolerance, Decreased strength, Impaired UE functional use, Pain, Difficulty walking, Decreased mobility, Decreased balance, Decreased range of motion, Decreased cognition, Dizziness, Decreased coordination, Postural dysfunction  Visit Diagnosis: Pain in left arm  Stiffness of left shoulder, not elsewhere classified  Muscle weakness (generalized)  Difficulty in walking, not elsewhere classified  Unsteadiness on feet     Problem List Patient Active Problem List   Diagnosis Date Noted  . TIA (transient ischemic attack) 01/23/2018  . Essential hypertension 01/23/2018  . Hyperlipidemia 01/23/2018  . Type 2 diabetes mellitus without complication ([place]) 72/62/0355    Bess Harvest, PTA 02/19/18 6:39 PM  [facility] [address]  [place], [place], [address] Phone: [phone]   Fax:  [phone]  Name: Shannon Coffey MRN: [id number] Date of Birth: 08-22-1935  PHYSICAL THERAPY DISCHARGE SUMMARY  Visits from Start of Care: 3  Current functional level related to goals / functional outcomes: See above, unable to assess as patient did not return   Remaining deficits: Unable to assess   Education / Equipment: See above  Plan: Patient agrees to discharge.  Patient goals were not met. Patient is being discharged due to not returning since the last visit.  ?????     Janene Harvey, PT,  DPT 03/24/18 6:41 PM

## 2018-02-24 ENCOUNTER — Ambulatory Visit: Payer: Medicare Other

## 2018-02-26 ENCOUNTER — Encounter: Payer: Medicare Other | Admitting: Physical Therapy

## 2019-06-19 IMAGING — MR MR HEAD W/O CM
10 of 11 series · 42 of 48 positions shown · non-contrast
Comparison: CT HEAD January 23, 2018

CLINICAL DATA: Weakness, aphasia. Suspect stroke. History of
hypertension, hypercholesterolemia and diabetes.

EXAM:
MRI HEAD WITHOUT CONTRAST
TECHNIQUE: Multiplanar, multiecho pulse sequences of the brain and surrounding
structures were obtained without intravenous contrast.

[Series 5: DWI · axial · 4.0mm · 0.92mm/px · z∈[-59,+71]mm · 7 of 70 slices shown (1 of 4)]
[im 1/70]
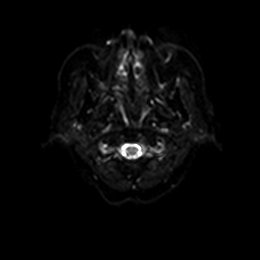
[im 12/70]
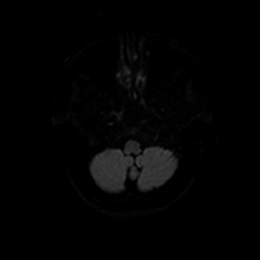
[im 24/70]
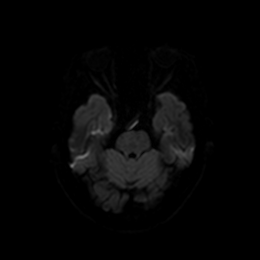
[im 35/70]
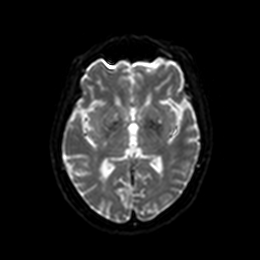
[im 47/70]
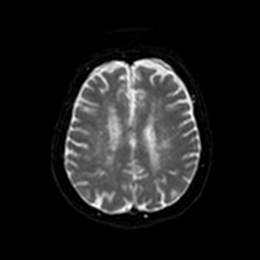
[im 58/70]
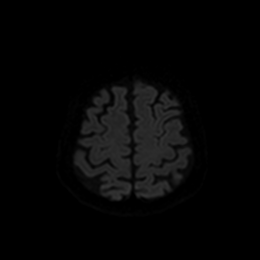
[im 70/70]
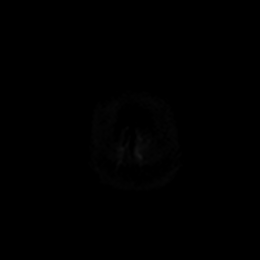

[Series 6: DWI · axial · 4.0mm · 0.92mm/px · z∈[-59,+71]mm · 3 of 35 slices shown (2 of 4)]
[im 1/35]
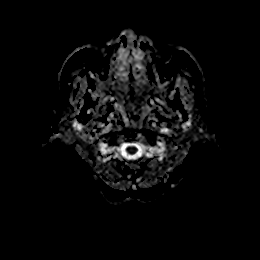
[im 18/35]
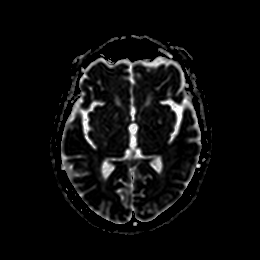
[im 35/35]
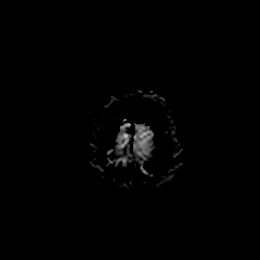

[Series 7: DWI · coronal · 4.0mm · 0.88mm/px · 7 of 62 slices shown (3 of 4)]
[im 1/62]
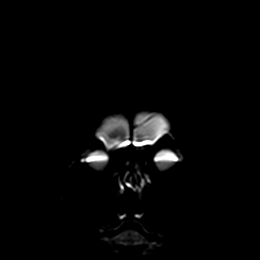
[im 11/62]
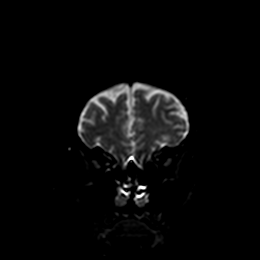
[im 21/62]
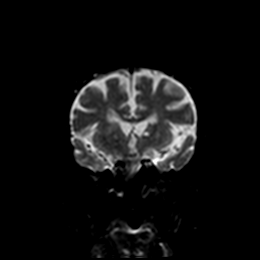
[im 31/62]
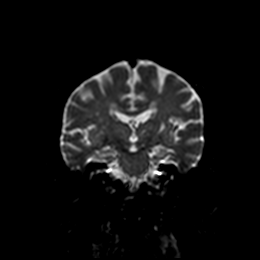
[im 41/62]
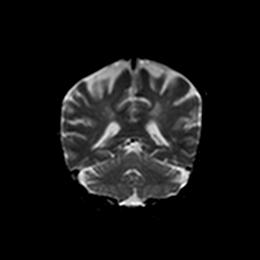
[im 51/62]
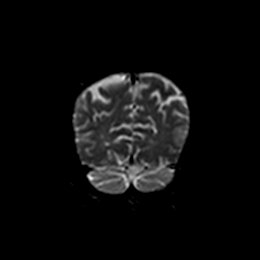
[im 62/62]
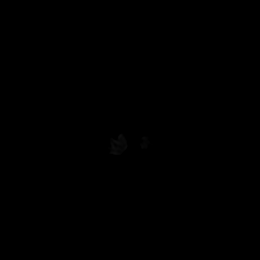

[Series 8: DWI · coronal · 4.0mm · 0.88mm/px · 3 of 31 slices shown (4 of 4)]
[im 1/31]
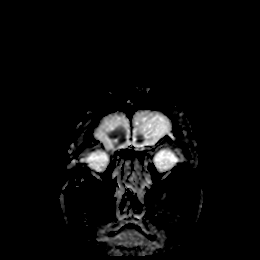
[im 16/31]
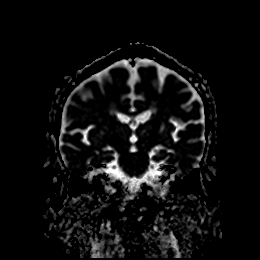
[im 31/31]
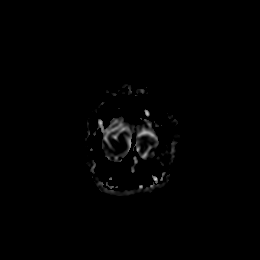

[Series 9: T2 · axial · 5.0mm · 0.75mm/px · z∈[-66,+72]mm · 3 of 25 slices shown (1 of 2)]
[im 1/25]
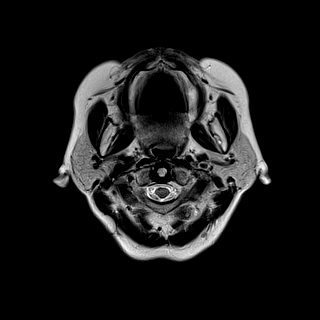
[im 13/25]
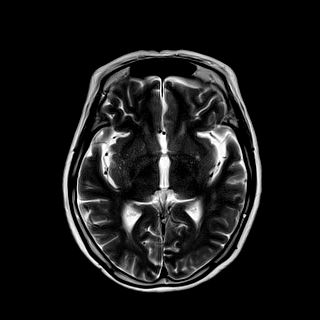
[im 25/25]
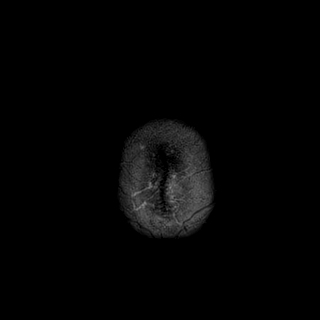

[Series 10: FLAIR · axial · 5.0mm · 0.47mm/px · z∈[-68,+69]mm · 3 of 25 slices shown]
[im 1/25]
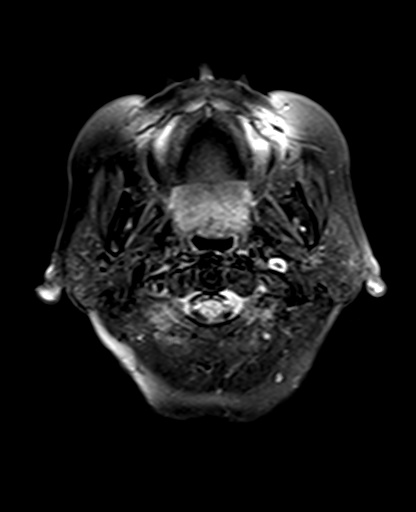
[im 13/25]
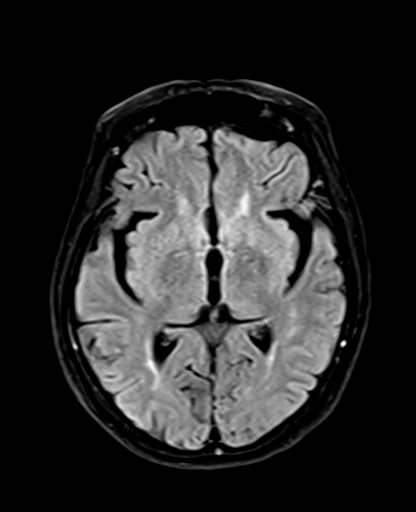
[im 25/25]
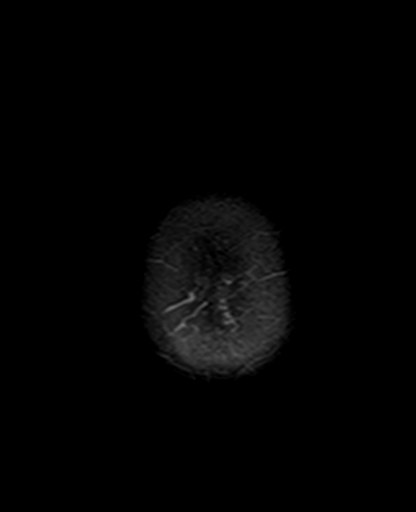

[Series 11: swi_images · axial · 3.0mm · 0.94mm/px · z∈[-75,+71]mm · 6 of 52 slices shown]
[im 1/52]
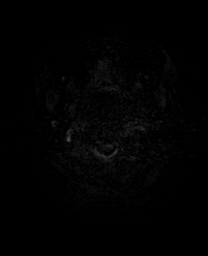
[im 11/52]
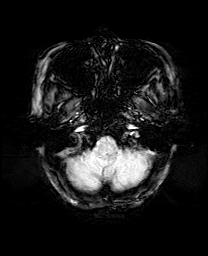
[im 21/52]
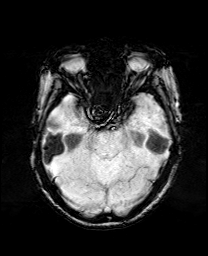
[im 31/52]
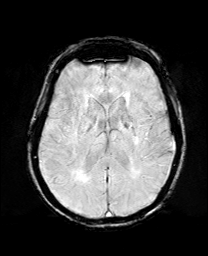
[im 41/52]
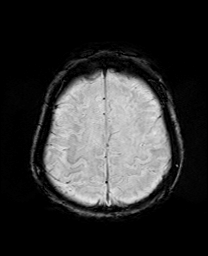
[im 52/52]
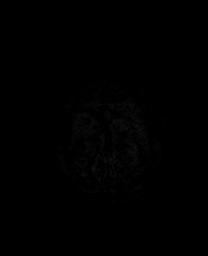

[Series 12: mip_images(sw) · axial · 24.0mm · 0.94mm/px · z∈[-65,+61]mm · 5 of 45 slices shown]
[im 1/45]
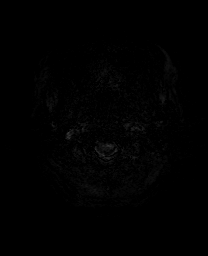
[im 12/45]
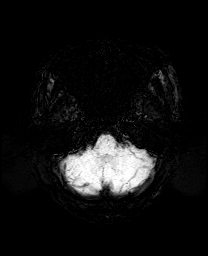
[im 23/45]
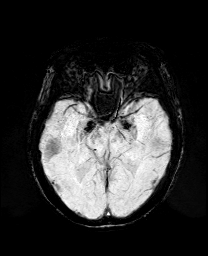
[im 34/45]
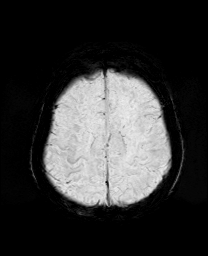
[im 45/45]
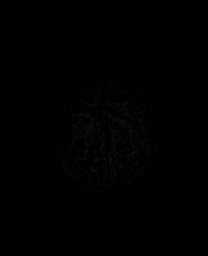

[Series 13: T1 · sagittal · 5.0mm · 0.75mm/px · 2 of 23 slices shown]
[im 1/23]
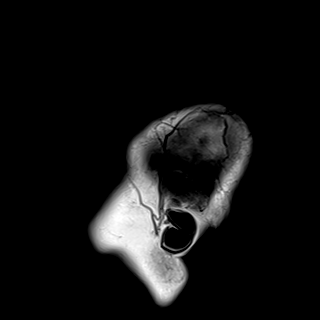
[im 23/23]
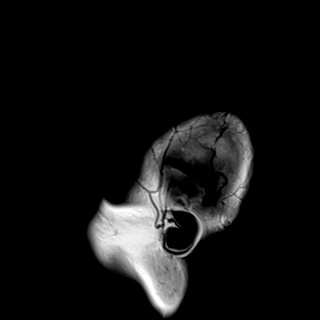

[Series 15: T2 · coronal · 5.0mm · 0.34mm/px · 3 of 26 slices shown (2 of 2)]
[im 1/26]
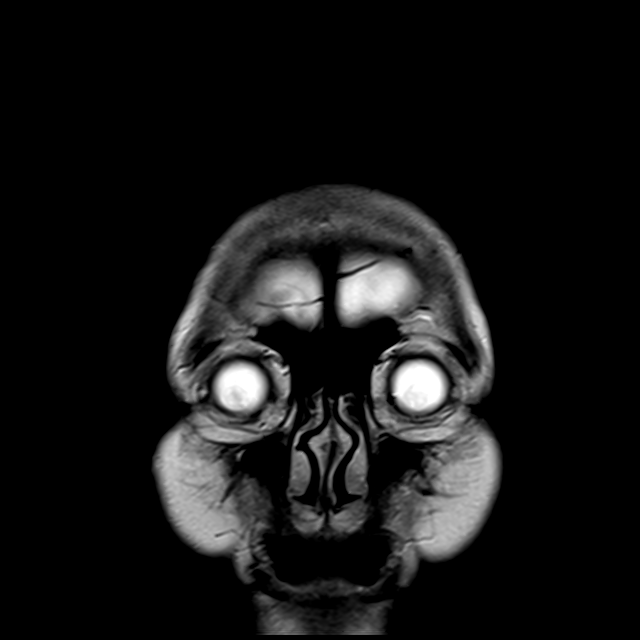
[im 13/26]
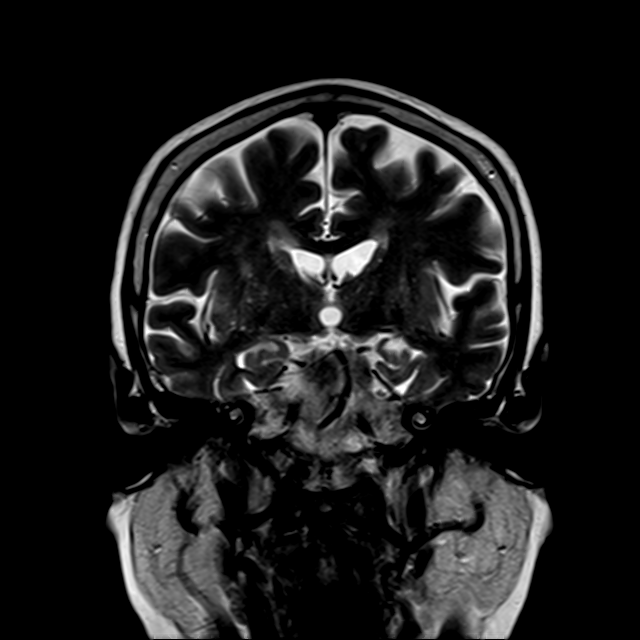
[im 26/26]
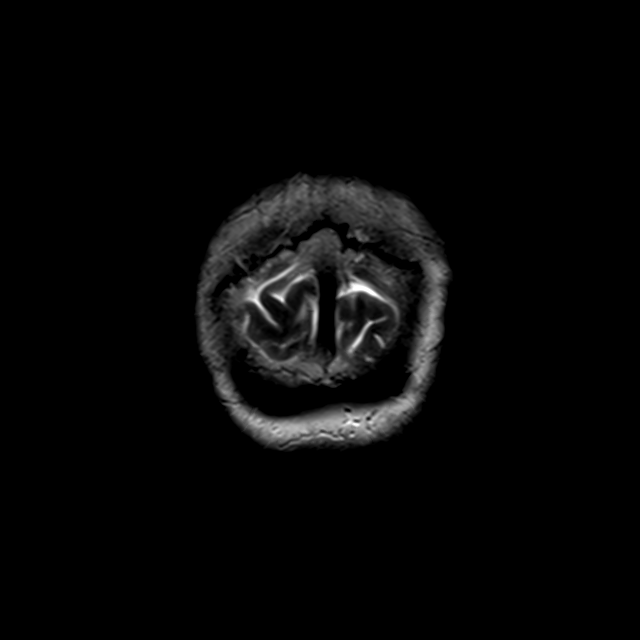

[42 of 48 positions shown; findings below may reference images not displayed]

FINDINGS: INTRACRANIAL CONTENTS: No reduced diffusion to suggest acute
ischemia. No susceptibility artifact to suggest hemorrhage. The
ventricles and sulci are normal for patient's age. Patchy to
confluent supratentorial white matter FLAIR T2 hyperintensities. Old
LEFT basal ganglia small infarcts. No suspicious parenchymal signal,
masses, mass effect. Hazy T2 hyperintensities bilateral basal
ganglia and thalami associated with chronic small vessel ischemic
changes. No abnormal extra-axial fluid collections. No extra-axial
masses.

VASCULAR: Normal major intracranial vascular flow voids present at
skull base.

SKULL AND UPPER CERVICAL SPINE: No abnormal sellar expansion. No
suspicious calvarial bone marrow signal. Generally bright T1 and T2
bone marrow signal compatible with osteopenia. Craniocervical
junction maintained.

SINUSES/ORBITS: The mastoid air-cells and included paranasal sinuses
are well-aerated.The included ocular globes and orbital contents are
non-suspicious. Status post bilateral ocular lens implants.

OTHER: Patient is edentulous.
IMPRESSION: 1. No acute intracranial process.
2. Moderate chronic small vessel ischemic changes and old LEFT basal
ganglia infarcts.

## 2019-06-19 IMAGING — CT CT HEAD CODE STROKE
3 series · 14 of 47 positions shown, 16 images · non-contrast
Comparison: None.

CLINICAL DATA: Code stroke.  Left-sided weakness

EXAM:
CT HEAD WITHOUT CONTRAST
TECHNIQUE: Contiguous axial images were obtained from the base of the skull
through the vertex without intravenous contrast.

[Series 3: head 5.0 st · axial · 0.39mm/px · z∈[-56,+68]mm · 8 of 30 slices shown, 10 images]
[im 3/30  brain]
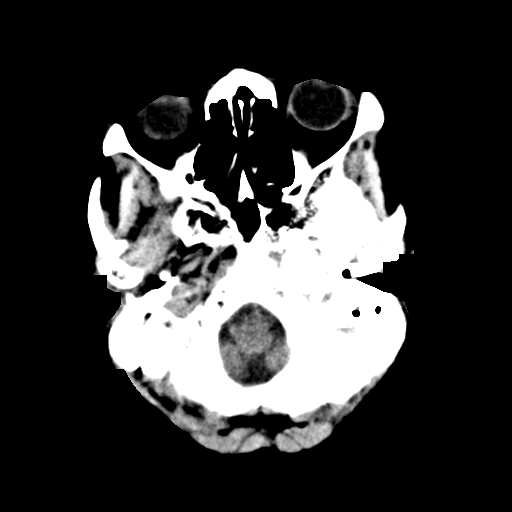
[im 3/30  bone]
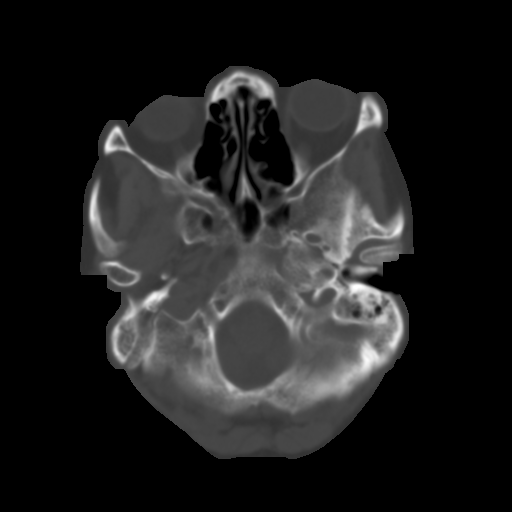
[im 7/30  brain]
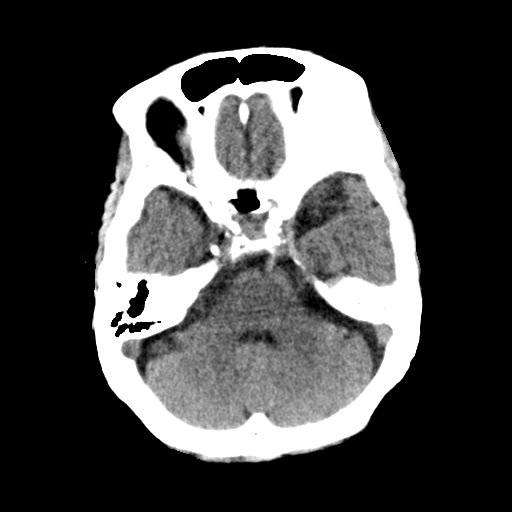
[im 10/30  brain]
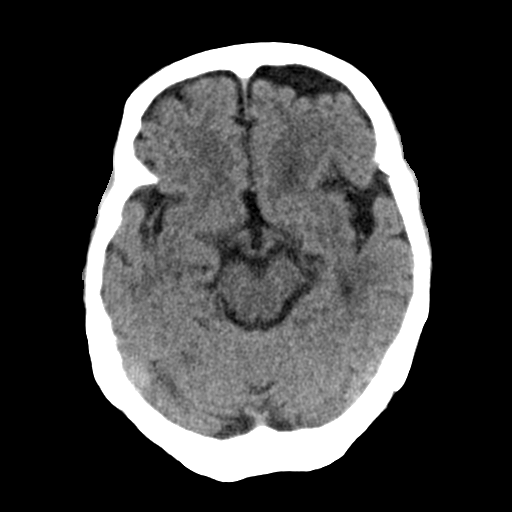
[im 14/30  brain]
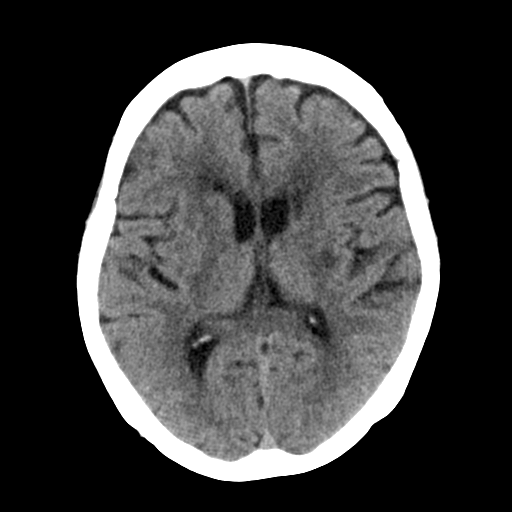
[im 17/30  brain]
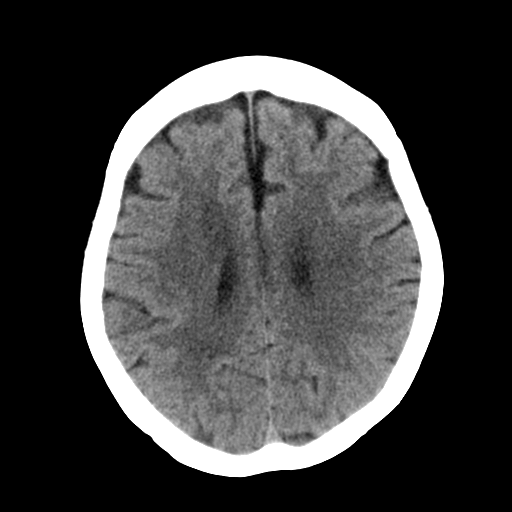
[im 17/30  bone]
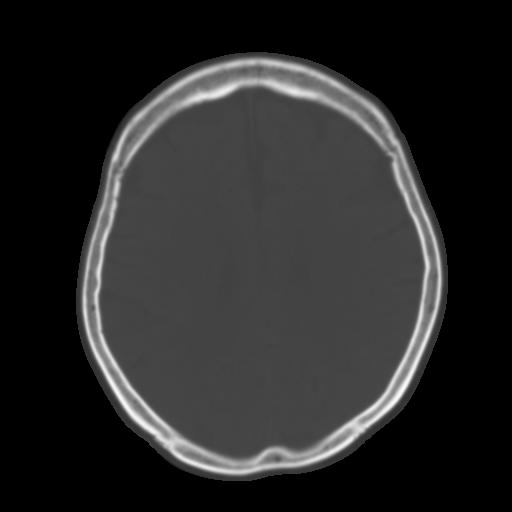
[im 21/30  brain]
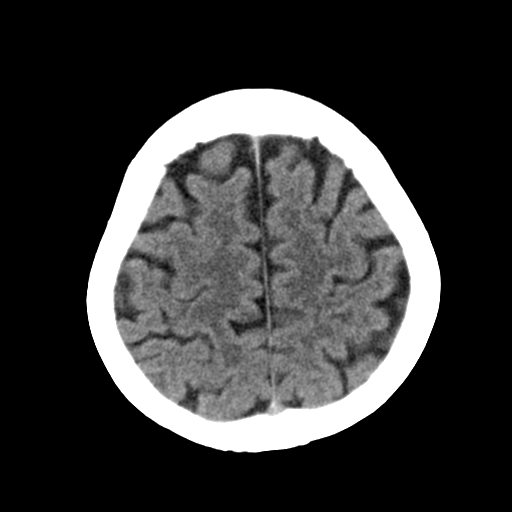
[im 24/30  brain]
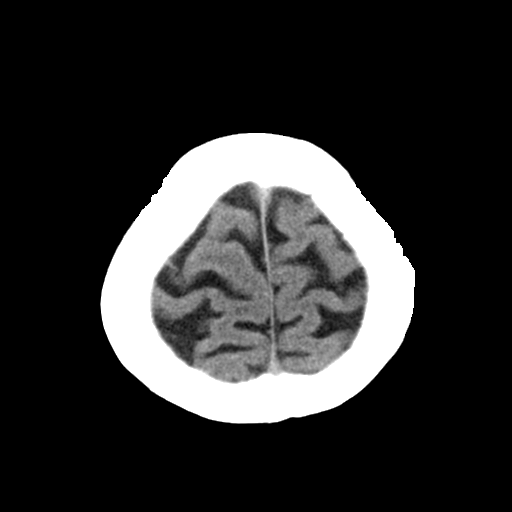
[im 28/30  brain]
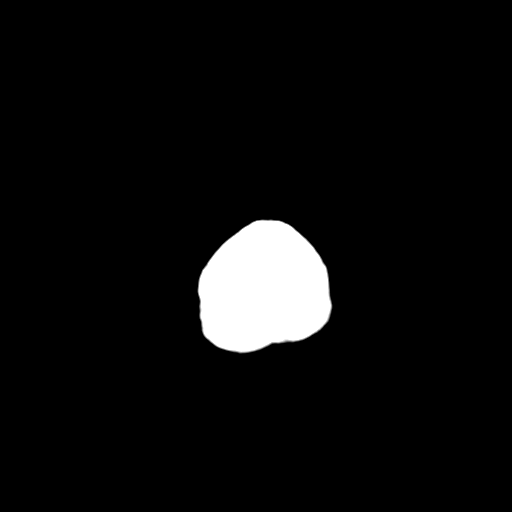

[Series 5: head 3.0 cor st · coronal · 0.28mm/px · 3 of 61 slices shown]
[im 21/61  brain]
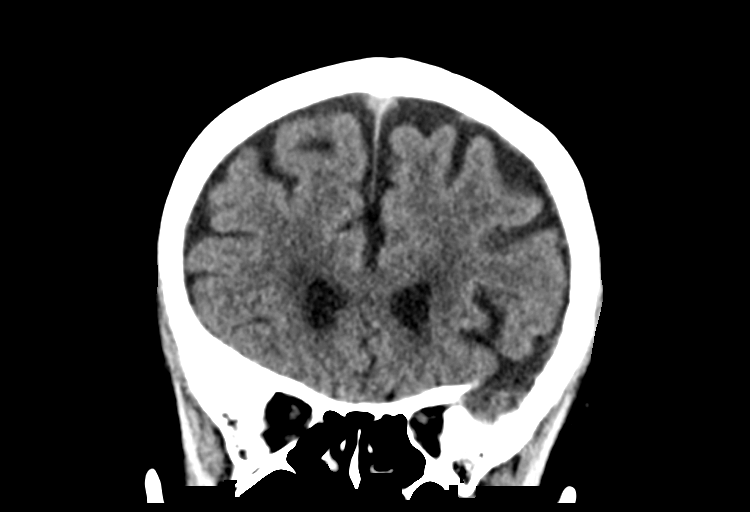
[im 27/61  brain]
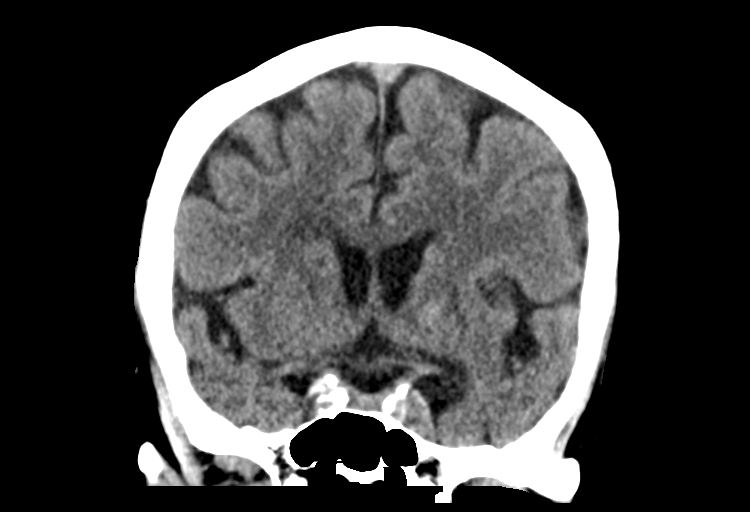
[im 34/61  brain]
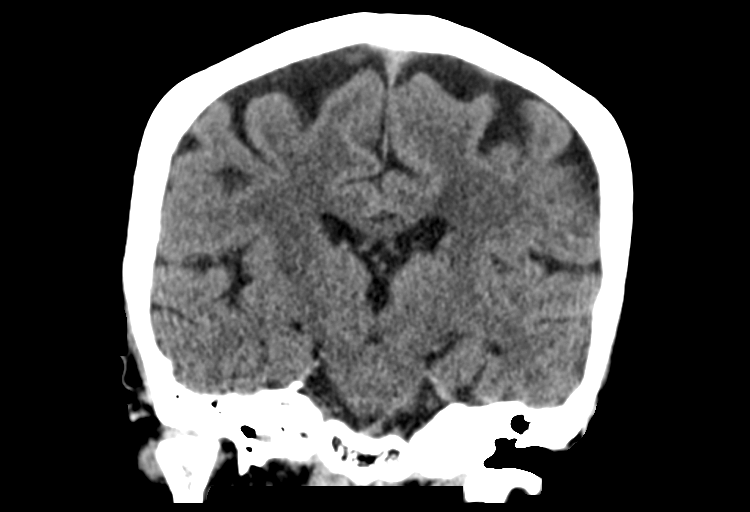

[Series 6: head 3.0 sag st · sagittal · 0.29mm/px · 3 of 49 slices shown]
[im 17/49  brain]
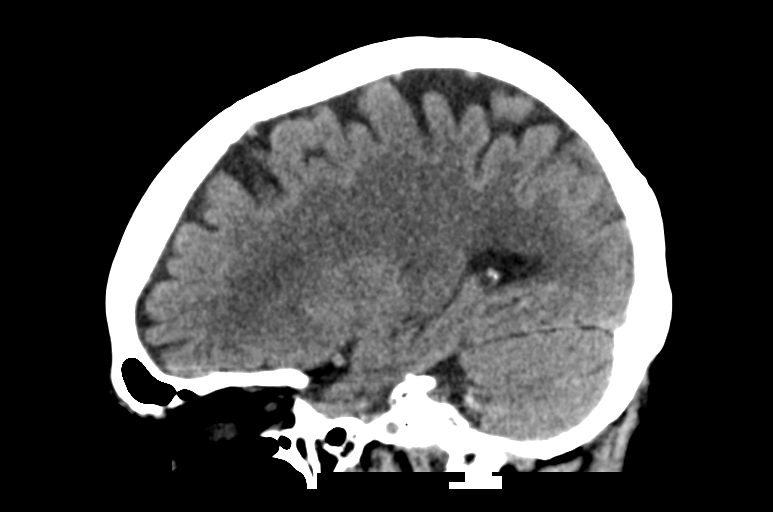
[im 25/49  brain]
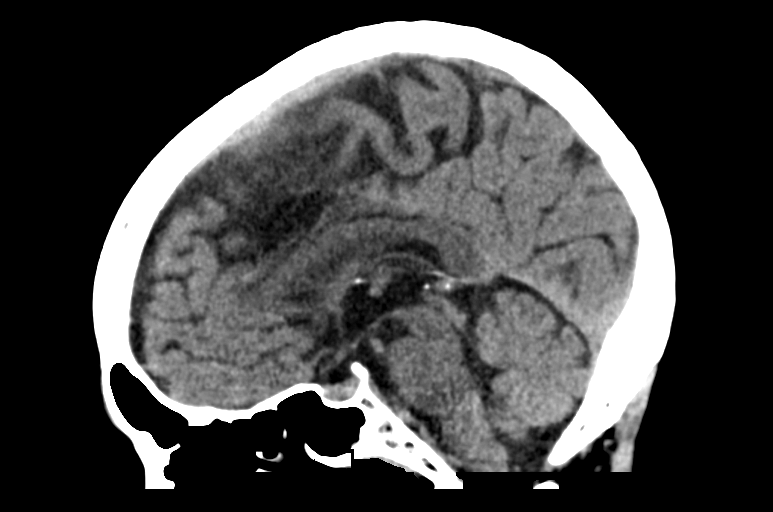
[im 33/49  brain]
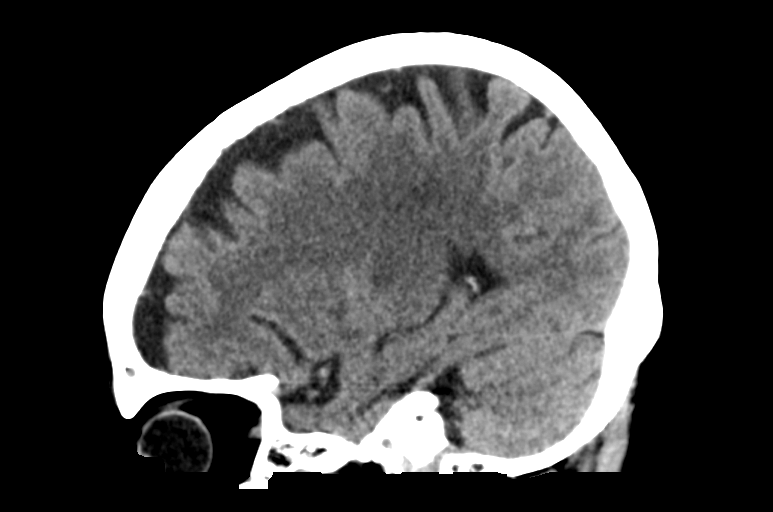

[14 of 47 positions shown; findings below may reference images not displayed]

FINDINGS: Brain: No evidence of acute infarction, hemorrhage, hydrocephalus,
extra-axial collection or mass lesion/mass effect. Low-density in
the cerebral white matter attributed to chronic small vessel
ischemia. Lacunar infarcts seen in the left putamen and caudate,
age-indeterminate but not contralateral to the symptomatic side

Vascular: Atherosclerotic calcification.  No hyperdense vessel.

Skull: No acute finding

Sinuses/Orbits: No acute finding.

Other: These results were communicated to Dr. Eshita Bin at [DATE]
[place] 01/23/2018by text page via the [employer] messaging system.

ASPECTS ([place] Stroke Program Early CT Score)

- Ganglionic level infarction (caudate, lentiform nuclei, internal
capsule, insula, M1-M3 cortex): 7

- Supraganglionic infarction (M4-M6 cortex): 3

Total score (0-10 with 10 being normal): 10
IMPRESSION: 1. No acute finding.ASPECTS is 10.
2. Chronic small vessel ischemia.
